# Patient Record
Sex: Female | Born: 1940 | Hispanic: No | Marital: Married | Smoking: Former smoker
Health system: Southern US, Community
[De-identification: ages and names within clinical notes are randomized; demographics above are authoritative.]

## PROBLEM LIST (undated history)

## (undated) DIAGNOSIS — S060X9A Concussion with loss of consciousness of unspecified duration, initial encounter: Secondary | ICD-10-CM

## (undated) DIAGNOSIS — R0789 Other chest pain: Secondary | ICD-10-CM

## (undated) DIAGNOSIS — K769 Liver disease, unspecified: Secondary | ICD-10-CM

## (undated) DIAGNOSIS — F32A Depression, unspecified: Secondary | ICD-10-CM

## (undated) DIAGNOSIS — F329 Major depressive disorder, single episode, unspecified: Secondary | ICD-10-CM

## (undated) DIAGNOSIS — I639 Cerebral infarction, unspecified: Secondary | ICD-10-CM

## (undated) DIAGNOSIS — I341 Nonrheumatic mitral (valve) prolapse: Secondary | ICD-10-CM

## (undated) DIAGNOSIS — R51 Headache: Secondary | ICD-10-CM

## (undated) DIAGNOSIS — R42 Dizziness and giddiness: Secondary | ICD-10-CM

## (undated) DIAGNOSIS — G4762 Sleep related leg cramps: Secondary | ICD-10-CM

## (undated) DIAGNOSIS — R413 Other amnesia: Secondary | ICD-10-CM

## (undated) DIAGNOSIS — E7800 Pure hypercholesterolemia, unspecified: Secondary | ICD-10-CM

## (undated) DIAGNOSIS — M1990 Unspecified osteoarthritis, unspecified site: Secondary | ICD-10-CM

## (undated) DIAGNOSIS — R269 Unspecified abnormalities of gait and mobility: Principal | ICD-10-CM

## (undated) DIAGNOSIS — I499 Cardiac arrhythmia, unspecified: Secondary | ICD-10-CM

## (undated) DIAGNOSIS — F0781 Postconcussional syndrome: Secondary | ICD-10-CM

## (undated) DIAGNOSIS — R519 Headache, unspecified: Secondary | ICD-10-CM

## (undated) DIAGNOSIS — Z889 Allergy status to unspecified drugs, medicaments and biological substances status: Secondary | ICD-10-CM

## (undated) DIAGNOSIS — M419 Scoliosis, unspecified: Secondary | ICD-10-CM

## (undated) DIAGNOSIS — E785 Hyperlipidemia, unspecified: Secondary | ICD-10-CM

## (undated) DIAGNOSIS — F419 Anxiety disorder, unspecified: Secondary | ICD-10-CM

## (undated) HISTORY — DX: Nonrheumatic mitral (valve) prolapse: I34.1

## (undated) HISTORY — DX: Concussion with loss of consciousness of unspecified duration, initial encounter: S06.0X9A

## (undated) HISTORY — DX: Depression, unspecified: F32.A

## (undated) HISTORY — DX: Hyperlipidemia, unspecified: E78.5

## (undated) HISTORY — DX: Postconcussional syndrome: F07.81

## (undated) HISTORY — DX: Major depressive disorder, single episode, unspecified: F32.9

## (undated) HISTORY — PX: OVARIAN CYST SURGERY: SHX726

## (undated) HISTORY — PX: TONSILLECTOMY: SUR1361

## (undated) HISTORY — PX: OTHER SURGICAL HISTORY: SHX169

## (undated) HISTORY — DX: Sleep related leg cramps: G47.62

## (undated) HISTORY — DX: Scoliosis, unspecified: M41.9

## (undated) HISTORY — DX: Headache: R51

## (undated) HISTORY — DX: Headache, unspecified: R51.9

## (undated) HISTORY — DX: Cardiac arrhythmia, unspecified: I49.9

## (undated) HISTORY — DX: Dizziness and giddiness: R42

## (undated) HISTORY — DX: Liver disease, unspecified: K76.9

## (undated) HISTORY — DX: Unspecified abnormalities of gait and mobility: R26.9

## (undated) HISTORY — DX: Anxiety disorder, unspecified: F41.9

## (undated) HISTORY — DX: Allergy status to unspecified drugs, medicaments and biological substances: Z88.9

## (undated) HISTORY — DX: Other amnesia: R41.3

## (undated) HISTORY — PX: RETINAL DETACHMENT SURGERY: SHX105

## (undated) HISTORY — DX: Pure hypercholesterolemia, unspecified: E78.00

## (undated) HISTORY — PX: COLONOSCOPY: SHX174

## (undated) HISTORY — DX: Other chest pain: R07.89

## (undated) HISTORY — DX: Unspecified osteoarthritis, unspecified site: M19.90

---

## 1998-12-24 ENCOUNTER — Encounter: Payer: Self-pay | Admitting: Internal Medicine

## 1998-12-24 ENCOUNTER — Ambulatory Visit (HOSPITAL_COMMUNITY): Admission: RE | Admit: 1998-12-24 | Discharge: 1998-12-24 | Payer: Self-pay | Admitting: Internal Medicine

## 2000-12-06 ENCOUNTER — Encounter: Payer: Self-pay | Admitting: Obstetrics and Gynecology

## 2000-12-06 ENCOUNTER — Encounter: Admission: RE | Admit: 2000-12-06 | Discharge: 2000-12-06 | Payer: Self-pay | Admitting: Obstetrics and Gynecology

## 2001-01-04 ENCOUNTER — Emergency Department (HOSPITAL_COMMUNITY): Admission: EM | Admit: 2001-01-04 | Discharge: 2001-01-04 | Payer: Self-pay | Admitting: Emergency Medicine

## 2001-05-11 HISTORY — PX: OTHER SURGICAL HISTORY: SHX169

## 2002-11-17 ENCOUNTER — Encounter: Admission: RE | Admit: 2002-11-17 | Discharge: 2002-11-17 | Payer: Self-pay | Admitting: Obstetrics and Gynecology

## 2002-11-17 ENCOUNTER — Encounter: Payer: Self-pay | Admitting: Obstetrics and Gynecology

## 2004-01-13 ENCOUNTER — Emergency Department (HOSPITAL_COMMUNITY): Admission: EM | Admit: 2004-01-13 | Discharge: 2004-01-14 | Payer: Self-pay | Admitting: Emergency Medicine

## 2004-07-24 ENCOUNTER — Ambulatory Visit (HOSPITAL_COMMUNITY): Admission: RE | Admit: 2004-07-24 | Discharge: 2004-07-24 | Payer: Self-pay | Admitting: Obstetrics and Gynecology

## 2005-11-06 ENCOUNTER — Ambulatory Visit (HOSPITAL_COMMUNITY): Admission: RE | Admit: 2005-11-06 | Discharge: 2005-11-06 | Payer: Self-pay | Admitting: Internal Medicine

## 2005-12-18 ENCOUNTER — Ambulatory Visit (HOSPITAL_BASED_OUTPATIENT_CLINIC_OR_DEPARTMENT_OTHER): Admission: RE | Admit: 2005-12-18 | Discharge: 2005-12-18 | Payer: Self-pay | Admitting: Orthopedic Surgery

## 2006-11-09 ENCOUNTER — Ambulatory Visit (HOSPITAL_COMMUNITY): Admission: RE | Admit: 2006-11-09 | Discharge: 2006-11-09 | Payer: Self-pay | Admitting: *Deleted

## 2007-07-14 ENCOUNTER — Encounter: Admission: RE | Admit: 2007-07-14 | Discharge: 2007-07-14 | Payer: Self-pay | Admitting: Family Medicine

## 2007-07-28 ENCOUNTER — Encounter: Admission: RE | Admit: 2007-07-28 | Discharge: 2007-07-28 | Payer: Self-pay | Admitting: Family Medicine

## 2008-02-08 ENCOUNTER — Encounter: Admission: RE | Admit: 2008-02-08 | Discharge: 2008-02-08 | Payer: Self-pay | Admitting: Family Medicine

## 2008-02-22 ENCOUNTER — Ambulatory Visit (HOSPITAL_COMMUNITY): Admission: RE | Admit: 2008-02-22 | Discharge: 2008-02-22 | Payer: Self-pay | Admitting: Family Medicine

## 2008-05-11 HISTORY — PX: CARDIAC CATHETERIZATION: SHX172

## 2009-03-28 ENCOUNTER — Ambulatory Visit (HOSPITAL_COMMUNITY): Admission: RE | Admit: 2009-03-28 | Discharge: 2009-03-28 | Payer: Self-pay | Admitting: Family Medicine

## 2009-09-09 ENCOUNTER — Encounter: Admission: RE | Admit: 2009-09-09 | Discharge: 2009-09-09 | Payer: Self-pay | Admitting: Family Medicine

## 2010-03-06 ENCOUNTER — Ambulatory Visit: Admission: RE | Admit: 2010-03-06 | Payer: Self-pay | Source: Home / Self Care | Admitting: Interventional Cardiology

## 2010-07-07 ENCOUNTER — Other Ambulatory Visit: Payer: Self-pay | Admitting: Gastroenterology

## 2010-07-11 ENCOUNTER — Ambulatory Visit
Admission: RE | Admit: 2010-07-11 | Discharge: 2010-07-11 | Disposition: A | Discharge status: Home / Self Care | Payer: 59 | Source: Ambulatory Visit | Attending: Gastroenterology | Admitting: Gastroenterology

## 2010-07-14 ENCOUNTER — Other Ambulatory Visit (HOSPITAL_COMMUNITY): Payer: Self-pay | Admitting: Internal Medicine

## 2010-07-14 DIAGNOSIS — Z1231 Encounter for screening mammogram for malignant neoplasm of breast: Secondary | ICD-10-CM

## 2010-07-23 ENCOUNTER — Ambulatory Visit (HOSPITAL_COMMUNITY)
Admission: RE | Admit: 2010-07-23 | Discharge: 2010-07-23 | Disposition: A | Discharge status: Home / Self Care | Payer: 59 | Source: Ambulatory Visit | Attending: Internal Medicine | Admitting: Internal Medicine

## 2010-07-23 DIAGNOSIS — Z1231 Encounter for screening mammogram for malignant neoplasm of breast: Secondary | ICD-10-CM | POA: Insufficient documentation

## 2010-09-26 NOTE — Op Note (Signed)
NAMESAESHA, LLERENAS             ACCOUNT NO.:  [id number]   MEDICAL RECORD NO.:  [id number]          PATIENT TYPE:  AMB   LOCATION:  DSC                          FACILITY:  [facility]   PHYSICIAN:  Katy Fitch. Sypher, M.D. 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   OPERATION:  1. Debridement of left thumb interphalangeal joint.  2. Excision of mucoid cyst left thumb distal phalangeal segment.   SURGEON:  Lovey Newcomer, M.D.   ASSISTANT:  Molly Maduro Dasnoit, P.A-C   ANESTHESIA:  2% lidocaine metacarpal head level block of left thumb  supplemented by IV sedation.   SUPERVISING ANESTHESIOLOGIST:  Janetta Hora. Gelene Mink, M.D.   INDICATIONS:  Libia Fazzini is a 70 year old woman referred for evaluation  and management of a  mucoid cyst involving her left thumb distal segment.  She has degenerative arthritis of multiple interphalangeal joints.  X-ray of  her thumb demonstrated a substantial loose body on the dorsal radial aspect  of her IP joint at the base of the distal phalanx.  This was likely the  irritant causing mucous cyst formation.   She requested excision of her cyst.  I recommended cyst excision and joint  debridement.   DESCRIPTION OF PROCEDURE:  Katanya Schlie is brought to the operating room  and placed in the supine position on the operating table.  Following light  sedation, the left arm was prepped  with Betadine soap and solution and  sterilely draped.  A 2% lidocaine metacarpal head level block was placed  without complication.  When anesthesia was satisfactory, the arm was  exsanguinated with an Esmarch bandage and an arterial tourniquet on the  proximal brachium inflated to 230 mmHg.  The procedure commenced with a  longitudinal incision direct ending over the cyst to the interphalangeal  extension creases.  The subcutaneous tissues were carefully divided taking  care to separate the cyst from the dermis.  This was circumferentially  dissected and removed with a rongeur.  The radial aspect of the IP joint was  explored.  The capsule between the radial collateral ligament and distal  extensor tendon resected followed by use of a microcurette and fine  periosteal elevator to remove a substantial loose body.  The loose body  measured 3 x 3 mm and was primarily cartilaginous with some ossification.  The joint was thoroughly  inspected and debrided with a micro rongeur and microcurette.  The joint was  then thoroughly lavaged with sterile saline utilizing a blunt dental needle.  The wound was repaired with mattress suture of 5-0 nylon.  There were no  apparent complications.  Katy Fitch Sypher, M.D.  Electronically Signed     RVS/MEDQ  D:  12/18/2005  T:  12/18/2005  Job:  [id number]

## 2011-05-10 ENCOUNTER — Ambulatory Visit (INDEPENDENT_AMBULATORY_CARE_PROVIDER_SITE_OTHER): Payer: 59

## 2011-05-10 DIAGNOSIS — J209 Acute bronchitis, unspecified: Secondary | ICD-10-CM

## 2011-11-13 ENCOUNTER — Other Ambulatory Visit (HOSPITAL_COMMUNITY): Payer: Self-pay | Admitting: Family Medicine

## 2011-11-13 DIAGNOSIS — Z1231 Encounter for screening mammogram for malignant neoplasm of breast: Secondary | ICD-10-CM

## 2011-12-04 ENCOUNTER — Ambulatory Visit (HOSPITAL_COMMUNITY): Payer: 59

## 2012-03-09 ENCOUNTER — Other Ambulatory Visit (HOSPITAL_COMMUNITY): Payer: Self-pay | Admitting: Family Medicine

## 2012-03-09 DIAGNOSIS — Z1231 Encounter for screening mammogram for malignant neoplasm of breast: Secondary | ICD-10-CM

## 2012-04-04 ENCOUNTER — Ambulatory Visit (HOSPITAL_COMMUNITY)
Admission: RE | Admit: 2012-04-04 | Discharge: 2012-04-04 | Disposition: A | Discharge status: Home / Self Care | Payer: 59 | Source: Ambulatory Visit | Attending: Family Medicine | Admitting: Family Medicine

## 2012-04-04 DIAGNOSIS — Z1231 Encounter for screening mammogram for malignant neoplasm of breast: Secondary | ICD-10-CM | POA: Insufficient documentation

## 2012-04-08 ENCOUNTER — Other Ambulatory Visit: Payer: Self-pay | Admitting: Family Medicine

## 2012-04-08 DIAGNOSIS — R928 Other abnormal and inconclusive findings on diagnostic imaging of breast: Secondary | ICD-10-CM

## 2012-04-19 ENCOUNTER — Ambulatory Visit
Admission: RE | Admit: 2012-04-19 | Discharge: 2012-04-19 | Disposition: A | Discharge status: Home / Self Care | Payer: 59 | Source: Ambulatory Visit | Attending: Family Medicine | Admitting: Family Medicine

## 2012-04-19 DIAGNOSIS — R928 Other abnormal and inconclusive findings on diagnostic imaging of breast: Secondary | ICD-10-CM

## 2012-06-20 ENCOUNTER — Other Ambulatory Visit: Payer: Self-pay | Admitting: Gastroenterology

## 2012-06-20 DIAGNOSIS — R1032 Left lower quadrant pain: Secondary | ICD-10-CM

## 2012-06-21 ENCOUNTER — Ambulatory Visit
Admission: RE | Admit: 2012-06-21 | Discharge: 2012-06-21 | Disposition: A | Discharge status: Home / Self Care | Payer: 59 | Source: Ambulatory Visit | Attending: Gastroenterology | Admitting: Gastroenterology

## 2012-06-21 DIAGNOSIS — R1032 Left lower quadrant pain: Secondary | ICD-10-CM

## 2012-06-21 MED ORDER — IOHEXOL 300 MG/ML  SOLN
100.0000 mL | Freq: Once | INTRAMUSCULAR | Status: AC | PRN
  Administered 2012-06-21: 100 mL via INTRAVENOUS

## 2013-10-30 ENCOUNTER — Other Ambulatory Visit (HOSPITAL_COMMUNITY): Payer: Self-pay | Admitting: Family Medicine

## 2013-10-30 DIAGNOSIS — Z1231 Encounter for screening mammogram for malignant neoplasm of breast: Secondary | ICD-10-CM

## 2013-11-07 ENCOUNTER — Ambulatory Visit (HOSPITAL_COMMUNITY)
Admission: RE | Admit: 2013-11-07 | Discharge: 2013-11-07 | Disposition: A | Discharge status: Home / Self Care | Payer: 59 | Source: Ambulatory Visit | Attending: Family Medicine | Admitting: Family Medicine

## 2013-11-07 DIAGNOSIS — Z803 Family history of malignant neoplasm of breast: Secondary | ICD-10-CM | POA: Insufficient documentation

## 2013-11-07 DIAGNOSIS — Z1231 Encounter for screening mammogram for malignant neoplasm of breast: Secondary | ICD-10-CM | POA: Insufficient documentation

## 2014-02-06 ENCOUNTER — Encounter: Payer: Self-pay | Admitting: *Deleted

## 2014-09-14 ENCOUNTER — Encounter: Payer: Self-pay | Admitting: Neurology

## 2014-09-14 ENCOUNTER — Ambulatory Visit (INDEPENDENT_AMBULATORY_CARE_PROVIDER_SITE_OTHER): Payer: PPO | Admitting: Neurology

## 2014-09-14 VITALS — BP 136/75 | HR 64 | Ht 67.0 in | Wt 152.4 lb

## 2014-09-14 DIAGNOSIS — R42 Dizziness and giddiness: Secondary | ICD-10-CM | POA: Diagnosis not present

## 2014-09-14 DIAGNOSIS — G4762 Sleep related leg cramps: Secondary | ICD-10-CM

## 2014-09-14 DIAGNOSIS — R269 Unspecified abnormalities of gait and mobility: Secondary | ICD-10-CM

## 2014-09-14 HISTORY — DX: Unspecified abnormalities of gait and mobility: R26.9

## 2014-09-14 HISTORY — DX: Sleep related leg cramps: G47.62

## 2014-09-14 NOTE — Patient Instructions (Signed)

## 2014-09-14 NOTE — Progress Notes (Signed)
Reason for visit: Gait disorder  Referring physician: Dr. Rennie Plowman is a 74 y.o. female  History of present illness:  Ms. Gryder is a 74 year old right-handed white female with a history of a gait disorder that she believes has been present for about one year. The patient believes there has been some progression in the ability to ambulate. She will stumble on occasion, and she might fall at times. The last fall was about 2 weeks prior to this evaluation. The patient reports some dizziness that is present with standing, and goes away with sitting. She reports some neck pain and low back pain without radiation down the arms. She has frequent leg and hand cramps, particularly at night. She denies any numbness or true weakness of extremities. She has some difficulty controlling the bowels and the bladder. She has a prior history of migraine headache, but she does not get headaches anymore at this time. Occasionally, she might have tingling around the mouth. She is on some medication for depression, but she only takes Prozac. She will take Ativan at night, only in low dose at 0.5 mg. Rarely, she may take alprazolam to help her sleep. She was seen by her primary care physician, and she was referred to this office for an evaluation.  Past Medical History  Diagnosis Date  . Atypical chest pain     normal ETT/Echo in 2003.. card cath normal 2010  . Hypercholesteremia   . Anxiety   . Multiple allergies   . Hyperlipidemia   . MVP (mitral valve prolapse)   . Arthritis   . Scoliosis   . Headache   . Liver disease   . Abnormal heart rhythm   . 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 Nocturnal leg cramps 09/14/2014    Past Surgical History  Procedure Laterality Date  . Cardiac catheterization  2010    normal  . Ett  2003    also ECHO..normal  . Ovarian cyst surgery    . Tonsillectomy    . Cesarean section    . Cataract surgery    . Colonoscopy      Family History  Problem Relation Age  of Onset  . Heart disease Mother   . CAD Mother   . AAA (abdominal aortic aneurysm) Father   . Bladder Cancer Father   . Kidney cancer Sister     metastasis to lung  . HIV Son     Social history:  reports that she quit smoking about 48 years ago. She does not have any smokeless tobacco history on file. She reports that she drinks alcohol. She reports that she does not use illicit drugs.  Medications:  Prior to Admission medications   Medication Sig Start Date End Date Taking? Authorizing Provider  ALPRAZolam [name]) 0.25 MG tablet Take 0.25 mg by mouth at bedtime as needed for anxiety.   Yes Historical Provider, MD  Calcium-Vitamin D-Vitamin K 500-100-40 MG-UNT-MCG CHEW Chew 1 tablet by mouth daily.   Yes Historical Provider, MD  cholecalciferol (VITAMIN D) 1000 UNITS tablet Take 2,000 Units by mouth daily.   Yes Historical Provider, MD  Coenzyme Q10 (CO Q 10 PO) Take 1 capsule by mouth daily.   Yes Historical Provider, MD  Cyanocobalamin 2500 MCG CHEW Chew 1 tablet by mouth daily.   Yes Historical Provider, MD  ezetimibe (ZETIA) 10 MG tablet Take 10 mg by mouth daily.   Yes Historical Provider, MD  FLUoxetine (PROZAC) 20 MG capsule Take 20 mg  by mouth daily.   Yes Historical Provider, MD  Glucosamine HCl 1500 MG TABS Take 1 tablet by mouth 2 (two) times daily.   Yes Historical Provider, MD  GuaiFENesin (MUCINEX PO) Take by mouth as directed. No specified directions in paperwork.   Yes Historical Provider, MD  Ibuprofen (ADVIL PO) Take by mouth as directed. No specified directions in paperwork   Yes Historical Provider, MD  LORazepam (ATIVAN) 0.5 MG tablet Take 0.5 mg by mouth at bedtime as needed for anxiety.   Yes Historical Provider, MD  Omega 3 1200 MG CAPS Take 1 capsule by mouth daily.   Yes Historical Provider, MD  Turmeric Curcumin 500 MG CAPS Take 1 capsule by mouth daily.   Yes Historical Provider, MD      Allergies  Allergen Reactions  . Indocin [Indomethacin] Other (See  Comments)    Nausea and vomiting   . Keflex [Cephalexin] Other (See Comments)    unknown reaction   . Lipitor [Atorvastatin] Other (See Comments)    Muscle aches   . Pravastatin Other (See Comments)    Muscle aches   . Tape Rash    PAPER TAPE --- RASH    ROS:  Out of a complete 14 system review of symptoms, the patient complains only of the following symptoms, and all other reviewed systems are negative.  Gait disorder Neck pain, back pain Depression, dizziness  Blood pressure 136/75, pulse 64, height 5\' 7"  (1.702 m), weight 152 lb 6.4 oz (69.128 kg).   Blood pressure, right arm, sitting is 1:30/80. Blood pressure, standing, right arm is 126/80.  Physical Exam  General: The patient is alert and cooperative at the time of the examination.  Eyes: Pupils are equal, round, and reactive to light. Discs are flat bilaterally.  Neck: The neck is supple, no carotid bruits are noted.  Respiratory: The respiratory examination is clear.  Cardiovascular: The cardiovascular examination reveals a regular rate and rhythm, no obvious murmurs or rubs are noted.  Skin: Extremities are without significant edema.  Neurologic Exam  Mental status: The patient is alert and oriented x 3 at the time of the examination. The patient has apparent normal recent and remote memory, with an apparently normal attention span and concentration ability.  Cranial nerves: Facial symmetry is present. There is good sensation of the face to pinprick and soft touch bilaterally. The strength of the facial muscles and the muscles to head turning and shoulder shrug are normal bilaterally. Speech is well enunciated, no aphasia or dysarthria is noted. Extraocular movements are full. Visual fields are full. The tongue is midline, and the patient has symmetric elevation of the soft palate. No obvious hearing deficits are noted.  Motor: The motor testing reveals 5 over 5 strength of all 4 extremities. Good symmetric  motor tone is noted throughout.  Sensory: Sensory testing is intact to pinprick, soft touch, vibration sensation, and position sense on all 4 extremities. No evidence of extinction is noted.  Coordination: Cerebellar testing reveals good finger-nose-finger and heel-to-shin bilaterally.  Gait and station: Gait is normal. Tandem gait is slightly unsteady. Romberg is negative. No drift is seen.  Reflexes: Deep tendon reflexes are symmetric and normal bilaterally, with exception that the ankle jerk reflexes were depressed. Toes are downgoing bilaterally.   Assessment/Plan:  1. Mild gait disorder  2. Nocturnal leg cramps  The patient reports some mild dizziness and some gait problems. The patient has minimal instability with clinical examination today. The patient will undergo some blood  work for evaluation, and she will be set up for MRI of the brain. If the studies are relatively unremarkable, the patient may benefit from balance training, and then enter activities that work on balance such as yoga or Pilates. The patient will follow-up in 4 months.  Jill Alexanders MD 09/14/2014 8:21 PM  [facility] [address] [place], [place] [address]  Phone [phone] Fax [phone]

## 2014-09-17 LAB — VITAMIN B12: Vitamin B-12: 1633 pg/mL — ABNORMAL HIGH (ref 211–946)

## 2014-09-17 LAB — CK: CK TOTAL: 69 U/L (ref 24–173)

## 2014-09-17 LAB — RPR: RPR: NONREACTIVE

## 2014-09-17 LAB — METHYLMALONIC ACID, SERUM: Methylmalonic Acid: 105 nmol/L (ref 0–378)

## 2014-09-17 LAB — COPPER, SERUM: COPPER: 124 ug/dL (ref 72–166)

## 2014-09-18 ENCOUNTER — Telehealth: Payer: Self-pay

## 2014-09-18 NOTE — Telephone Encounter (Signed)
Left voicemail. Relayed results.

## 2014-09-18 NOTE — Telephone Encounter (Signed)
-----   Message from Kathrynn Ducking, MD sent at 09/17/2014  2:16 PM EDT -----  The blood work results are unremarkable. Please call the patient.  ----- Message -----    From: [facility] Lab Results In Interface    Sent: 09/15/2014   7:45 AM      To: Kathrynn Ducking, MD

## 2014-09-23 ENCOUNTER — Ambulatory Visit
Admission: RE | Admit: 2014-09-23 | Discharge: 2014-09-23 | Disposition: A | Discharge status: Home / Self Care | Payer: Self-pay | Source: Ambulatory Visit | Attending: Neurology | Admitting: Neurology

## 2014-09-23 DIAGNOSIS — R269 Unspecified abnormalities of gait and mobility: Secondary | ICD-10-CM

## 2014-09-23 DIAGNOSIS — G4762 Sleep related leg cramps: Secondary | ICD-10-CM

## 2014-09-23 DIAGNOSIS — R42 Dizziness and giddiness: Secondary | ICD-10-CM | POA: Diagnosis not present

## 2014-09-24 ENCOUNTER — Telehealth: Payer: Self-pay | Admitting: Neurology

## 2014-09-24 DIAGNOSIS — R269 Unspecified abnormalities of gait and mobility: Secondary | ICD-10-CM

## 2014-09-24 NOTE — Telephone Encounter (Signed)
I called the patient. The MRI of the brain shows some BS compression by vascular structures, this is the likely source of the dizziness and the gait problems. I will set up PT for this.    MRI brain 09/24/14:  IMPRESSION: This MRI of the brain without contrast shows the following: 1. 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 The right vertebral artery is very tortuous and has a diameter of 6.7 mm. The artery slightly displaces the medulla towards the left. 2. Mild cortical atrophy that is more evident in the mesial temporal lobes 3. Scattered T2/flair hyperintense foci consistent with small vessel ischemic changes. The extent is mildly more than expected for age.

## 2014-09-25 NOTE — Telephone Encounter (Signed)
I spoke to the patient. Laura Pollard stated Laura Pollard was shocked when Laura Pollard received the call from Dr. Jannifer Franklin. Since the call Laura Pollard has thought of several questions would like to ask him. Laura Pollard wants to know the actual diagnosis so Laura Pollard can research it, the cause of it, how long Laura Pollard could have had it, her prognosis, and if Laura Pollard should have any lifting restrictions at work. Please call and advise.

## 2014-09-25 NOTE — Telephone Encounter (Signed)
I called the patient. I discussed the results of the MRI again. The patient asked the same question 3 times, she may be having significant issues with memory. This may require workup in the future.

## 2014-09-25 NOTE — Telephone Encounter (Signed)
Patient called back and requested to speak with the nurse regarding some questions she has about her results. Please call and advise.

## 2014-10-04 ENCOUNTER — Encounter: Payer: Self-pay | Admitting: Neurology

## 2014-10-04 ENCOUNTER — Telehealth: Payer: Self-pay | Admitting: Neurology

## 2014-10-04 NOTE — Telephone Encounter (Signed)
I called the patient. She had an episode at work where she was perspiring, shaking and felt dizzy. She said this episode was more intense than before and she wanted to speak to Dr. Jannifer Franklin about what she could do to prevent this from happening. I asked if she had started PT yet, as Dr. Jannifer Franklin had advised but she has not heard from them. I will follow up on this referral. She also wanted Dr. Jannifer Franklin to be aware that her psychiatrist started her on temazepam recently and she wondered if this could be causing her symptoms to be worse. She seems to still have questions about her MRI, as well.

## 2014-10-04 NOTE — Telephone Encounter (Signed)
I called patient. The patient had a dizzy episode associated with diaphoresis, feeling shaky, nauseated. The patient sat down, took about 15 minutes to feel better. This sounds like a vasovagal event to me. I discussed this with the patient. Given her issues, I'll write a letter indicated that she is not to climb on ladders at work.

## 2014-10-04 NOTE — Telephone Encounter (Signed)
Called [facility] they had not scheduled patient yet. Patient is scheduled for June 6th arrive at 10:45. I have called patient she is aware of date, time and where to go for her apt. Patient is fine and understood process.

## 2014-10-04 NOTE — Telephone Encounter (Signed)
Patient called stating she had more questions regarding the MRI results. She also states she had an episode where she had to sit down(did not want to into detail. Please call and advise. Patient can be reached at [phone].

## 2014-10-15 ENCOUNTER — Telehealth: Payer: Self-pay | Admitting: Physical Therapy

## 2014-10-15 ENCOUNTER — Encounter: Payer: Self-pay | Admitting: Physical Therapy

## 2014-10-15 ENCOUNTER — Ambulatory Visit: Payer: PPO | Attending: Neurology | Admitting: Physical Therapy

## 2014-10-15 DIAGNOSIS — R269 Unspecified abnormalities of gait and mobility: Secondary | ICD-10-CM | POA: Insufficient documentation

## 2014-10-15 DIAGNOSIS — R278 Other lack of coordination: Secondary | ICD-10-CM | POA: Insufficient documentation

## 2014-10-15 DIAGNOSIS — R42 Dizziness and giddiness: Secondary | ICD-10-CM | POA: Diagnosis not present

## 2014-10-15 NOTE — Telephone Encounter (Signed)
Patient has seen PT, who reccomends ST for cognitive impairments. This has not been assessed in our office, Will do cognitive eval on the next RV.

## 2014-10-15 NOTE — Therapy (Signed)
[facility] [address] [place], [place], [address] Phone: [phone]   Fax:  [phone]  Physical Therapy Evaluation  Patient Details  Name: Laura Pollard MRN: [id number] Date of Birth: 12/07/1940 Referring Provider:  Kathrynn Ducking, MD  Encounter Date: 10/15/2014      PT End of Session - 10/15/14 1233    Visit Number 1   Number of Visits 9   Date for PT Re-Evaluation 12/14/14   Authorization Type Medicare - G Codes every 10th visit   PT Start Time 1107   PT Stop Time 1202   PT Time Calculation (min) 55 min   Equipment Utilized During Treatment Gait belt   Activity Tolerance Patient tolerated treatment well   Behavior During Therapy [facility] for tasks assessed/performed      Past Medical History  Diagnosis Date  . Atypical chest pain     normal ETT/Echo in 2003.. card cath normal 2010  . Hypercholesteremia   . Anxiety   . Multiple allergies   . Hyperlipidemia   . MVP (mitral valve prolapse)   . Arthritis   . Scoliosis   . Headache   . Liver disease   . Abnormal heart rhythm   . 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 Nocturnal leg cramps 09/14/2014  . Depression     Past Surgical History  Procedure Laterality Date  . Cardiac catheterization  2010    normal  . Ett  2003    also ECHO..normal  . Ovarian cyst surgery    . Tonsillectomy    . Cesarean section    . Cataract surgery    . Colonoscopy    . Retinal detachment surgery Right 10 years ago    There were no vitals filed for this visit.  Visit Diagnosis:  Abnormality of gait  Dizziness and giddiness  Abnormal coordination      Subjective Assessment - 10/15/14 1120    Subjective Pt dizziness, lightheadedness, frequent LOB, postural sway (per report of co-workers), and frequent falls. Pt unsure as to what causes dizziness, but does attribute lightheadedness to "getting up too fast," per pt.  Pt reports increasingly more difficulty working  full-time secondary to balance impairments.   Pertinent History Vertebrobasilar Artery Insufficiency (VBI) - Avoid endrange cervical spine extension; Anxiety   Currently in Pain? No/denies            [facility] PT Assessment - 10/15/14 0001    Assessment   Medical Diagnosis Abnormality of gait   Onset Date/Surgical Date 09/24/14  MD referral date   Prior Therapy N/A   Precautions   Precautions Fall;Other (comment)   Precaution Comments No getting onto ladders   Required Braces or Orthoses Other Brace/Splint   Restrictions   Weight Bearing Restrictions No   Balance Screen   Has the patient fallen in the past 6 months Yes   How many times? 1  "I've only fallen to the ground once; I catch myself a lot"   Has the patient had a decrease in activity level because of a fear of falling?  Yes   Is the patient reluctant to leave their home because of a fear of falling?  No   Home Social worker Private residence   Living Arrangements Spouse/significant other   Available Help at Discharge Family   Type of [place] to enter   Entrance Stairs-Number of Steps 2   [place]  Two level   Alternate Level Stairs-Number of Steps 26  2 flights   Alternate Level Stairs-Rails Can reach both   Home Equipment None   Prior Function   Level of Independence Independent;Independent with basic ADLs;Independent with community mobility without device;Independent with gait   Vocation Full time employment   Vocation Requirements Works at [employer] Status Impaired/Different from baseline   Area of Impairment Attention;Memory   Current Attention Level Sustained   Memory Decreased short-term memory   Memory Comments "Feels like there's dust in there"   Attention Sustained   Sustained Attention Impaired   Sustained Attention Impairment Verbal complex   Memory Impaired   Memory Impairment Decreased short term  memory;Retrieval deficit   Sensation   Light Touch Appears Intact   Proprioception Appears Intact   Additional Comments Per chart, tingling around mouth   Coordination   Gross Motor Movements are Fluid and Coordinated No   Coordination and Movement Description Uncoordinated gait more prominent with turning and obstacle negotiation   ROM / Strength   AROM / PROM / Strength Strength   Strength   Overall Strength Within functional limits for tasks performed   Overall Strength Comments B LE   Ambulation/Gait   Ambulation/Gait Yes   Ambulation/Gait Assistance 4: Min guard;4: Min assist   Ambulation/Gait Assistance Details Min A to recover from LOB with obstacle negotiation, turning 180 degrees, and with head turns in all directions (most prominent with looking to L and upward)   Ambulation Distance (Feet) 350 Feet   Assistive device None   Gait Pattern Step-through pattern;Wide base of support;Lateral trunk lean to right   Ambulation Surface Level;Indoor   Stairs Yes   Stairs Assistance 5: Supervision   Stair Management Technique No rails;Alternating pattern;Step to pattern;Forwards   Number of Stairs 4   Height of Stairs 6   Gait Comments ascended stairs with reciprocal pattern; descended with step-to   Standardized Balance Assessment   Standardized Balance Assessment Dynamic Gait Index   Dynamic Gait Index   Level Surface Mild Impairment   Change in Gait Speed Normal   Gait with Horizontal Head Turns Mild Impairment  increased nausea with head turn to L    Gait with Vertical Head Turns Moderate Impairment  increased dizziness, postural instability with looking up   Gait and Pivot Turn Severe Impairment  Significant LOB   Step Over Obstacle Mild Impairment   Step Around Obstacles Moderate Impairment   Steps Moderate Impairment  step-to pattern during descent   Total Score 12              PT Education - 10/15/14 1232    Education provided Yes   Education Details  Evaluation findings, goals, POC.   Person(s) Educated Patient   Methods Explanation   Comprehension Verbalized understanding          PT Short Term Goals - 10/15/14 1255    PT SHORT TERM GOAL #1   Title Pt will demonstrate HEP with mod I using paper handout to maximize functional gains made in physical therapy. Target date: 74//16.   Time 4   Period Weeks   Status New   PT SHORT TERM GOAL #2   Title Pt will verbalize understanding of fall prevention strategies to decrease risk of falling within home. Target date: 11/12/14.   Time 4   Period Weeks   Status New   PT SHORT TERM GOAL #3   Title Pt will score  16/24 on Dynamic Gait Index to demonstrate improved gait stability in the presence of external demands. Target date: 11/12/14.   Time 4   Period Weeks   Status New           PT Long Term Goals - 16-Oct-2014 1255    PT LONG TERM GOAL #1   Title Pt will score 20/24 on Dynamic Gait Index to demonstrate decreased risk of falling. Target date: 12/10/14.   Time 8   Period Weeks   Status New   PT LONG TERM GOAL #2   Title Pt will ambulate >500' over level/unlevel surfaces with mod I using LRAD to demonstrate safety with community mobility. Target date: 12/10/14.   Time 8   Period Weeks   Status New   PT LONG TERM GOAL #3   Title Pt will demonstrate use of 2 compensatory strategies for dizziness during functional mobility without cueing to increase pt safety with functional mobility. Target date: 12/10/14.   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  On PT evaluation, the pt demonstrates the following impairments:  increased risk of falling, as exhibited by score of 12/24 on Dynamic Gait Index; gait instability, as demonstrated by multiple significant  losses of balance with 180-turns and obstacle negotiation during gait; cognitive impairments, as reported by patient and noted during PT evaluation. Recommending skilled PT 2x/week for 8 weeks; however, pt will be seen for skilled PT 1x/week for 8 weeks, as pt is limited by full-time jo   Pt will benefit from skilled therapeutic intervention in order to improve on the following deficits Abnormal gait;Decreased balance;Decreased cognition;Decreased mobility;Decreased knowledge of use of DME;Decreased coordination;Decreased safety awareness;Postural dysfunction;Dizziness;Decreased activity tolerance;Impaired perceived functional ability   Rehab Potential Good   Clinical Impairments Affecting Rehab Potential Cognitive impairments   PT Frequency 1x / week  Recommending 2x/week for 8 weeks; however, pt limited by full-time job to [employer]   PT Duration 8 weeks   PT Treatment/Interventions ADLs/Self Care Home Management;Vestibular;Functional mobility training;Stair training;Gait training;DME Instruction;Therapeutic activities;Therapeutic exercise;Balance training;Neuromuscular re-education;Cognitive remediation;Patient/family education;Manual techniques   PT Next Visit Plan Initiate HEP; mildly provoke symptoms (avoiding cervical spine extension, lightheadedness, nausea); initiate compensatory strategies (wide BOS with onset of symptoms; eyes then head the body with turning) and assess for within-session carryover   PT Home Exercise Plan Progress from standing to ambulation with head turns; attempt vestibular adaptation (gaze stabilization, static/dynamic balance exercises) then compensatory strategies to increase safety with onset of dizziness.   Recommended Other Services Phone encounter completed to recommend fro SLP evaluation; follow up on this order.   Consulted and Agree with Plan of Care Patient          G-Codes - 2014/10/16 1257    Functional Assessment Tool Used Dynamic Gait Index   Functional  Limitation Mobility: Walking and moving around   Mobility: Walking and Moving Around Current Status [phone]) At least 40 percent but less than 60 percent impaired, limited or restricted   Mobility: Walking and Moving Around Goal Status [phone]) At least 1 percent but less than 20 percent impaired, limited or restricted       Problem List Patient Active Problem List   Diagnosis Date Noted  . 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  Dizziness and giddiness 09/14/2014  . Nocturnal leg cramps 09/14/2014    Billie Ruddy, PT, DPT [facility] [address] [place], [place], [address] Phone: [phone]   Fax:  [phone] 10/15/2014, 4:00 PM

## 2014-10-15 NOTE — Telephone Encounter (Signed)
PT evaluation completed. Patient would benefit from Speech Therapy evaluation due to cognitive impairments. Please send referral for SLP evaluation if you agree.  Thanks so much,  Billie Ruddy, PT, DPT Outpatient Neurorehab

## 2014-11-02 ENCOUNTER — Telehealth: Payer: Self-pay | Admitting: Physical Therapy

## 2014-11-02 ENCOUNTER — Ambulatory Visit: Payer: PPO | Admitting: Physical Therapy

## 2014-11-02 DIAGNOSIS — R269 Unspecified abnormalities of gait and mobility: Secondary | ICD-10-CM

## 2014-11-02 DIAGNOSIS — R278 Other lack of coordination: Secondary | ICD-10-CM

## 2014-11-02 DIAGNOSIS — R42 Dizziness and giddiness: Secondary | ICD-10-CM

## 2014-11-02 NOTE — Telephone Encounter (Signed)
Dr. Jannifer Franklin,  Thank you for acknowledging that. The patient has indicated during her PT session with me today that she won't be returning to your office until September. Would it be possible for you to assess her cognition before then? Per my conversation with the patient, this seems to be impacting her quality of life and safety.  Thanks so much, Billie Ruddy, PT, DPT [facility] [address] [place], [place], [address] Phone: [phone]   Fax:  [phone] 11/02/2014, 1:17 PM

## 2014-11-02 NOTE — Telephone Encounter (Signed)
We will evaluate her cognitive issues in the future.

## 2014-11-02 NOTE — Patient Instructions (Addendum)
Gaze Stabilization: Standing Feet Apart   Feet shoulder width apart, keeping eyes on target on wall arm's length away, tilt head down 15-30 and move head side to side for _30___ seconds. Repeat while moving head up and down for _30___ seconds. Do this 3 times per day.  Feet Apart (Compliant Surface) Varied Arm Positions - Eyes Open   With eyes open, standing on compliant surface: PILLOW, feet shoulder width apart and arms AT YOUR SIDE, look at a stationary object. Hold  30-45 seconds. Repeat  3  times per session. Do 1-2 sessions per day. Repeat the same exercise with eyes closed.      Copyright  VHI. All rights reserved.

## 2014-11-02 NOTE — Therapy (Signed)
[facility] [address] [place], [place], [address] Phone: [phone]   Fax:  [phone]  Physical Therapy Treatment  Patient Details  Name: Laura Pollard MRN: [id number] Date of Birth: 1941/03/20 Referring Provider:  Antony Blackbird, MD  Encounter Date: 11/02/2014      PT End of Session - 11/02/14 1251    Visit Number 2   Number of Visits 9   Date for PT Re-Evaluation 12/14/14   Authorization Type Medicare - G Codes every 10th visit   PT Start Time 54  Pt arrived late for session   PT Stop Time 1234   PT Time Calculation (min) 41 min   Equipment Utilized During Treatment Gait belt   Activity Tolerance Patient tolerated treatment well   Behavior During Therapy Anxious      Past Medical History  Diagnosis Date  . Atypical chest pain     normal ETT/Echo in 2003.. card cath normal 2010  . Hypercholesteremia   . Anxiety   . Multiple allergies   . Hyperlipidemia   . MVP (mitral valve prolapse)   . Arthritis   . Scoliosis   . Headache   . Liver disease   . Abnormal heart rhythm   . 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 Nocturnal leg cramps 09/14/2014  . Depression     Past Surgical History  Procedure Laterality Date  . Cardiac catheterization  2010    normal  . Ett  2003    also ECHO..normal  . Ovarian cyst surgery    . Tonsillectomy    . Cesarean section    . Cataract surgery    . Colonoscopy    . Retinal detachment surgery Right 10 years ago    There were no vitals filed for this visit.  Visit Diagnosis:  Abnormality of gait  Dizziness and giddiness  Abnormal coordination      Subjective Assessment - 11/02/14 1200    Subjective Pt reports no falls, one "near-miss" since PT evaluation. Pt reporting ongoing dizziness, vertginous symptoms. Pt inquiring as to whether or not this is because of the crystals in her ears. Pt unable to recall conversation about VBI on evaluation. Pt expressing emotional  distress due to inability to remember the faces of old friends at work. When notified that referring MD recommending pt have cognitive testing at next MD appt, pt stated, "but that's in September," and was tearful for brief time.   Pertinent History Vertebrobasilar Artery Insufficiency (VBI) - Avoid endrange cervical spine extension; Anxiety   Currently in Pain? No/denies          Treatment    Therapeutic Activities: - See patient education section for detail.  Neuro Re-ed: Pt performed the following corner balance exercises with supervision, chair in front of pt to ensure pt safety. - Standing on compliant surface (one pillow) with feet shoulder-width apart with eyes closed x30 seconds (3 trials). Noted posterior preference. Added to HEP. - Standing on non-compliant surface with eyes closed, pt performed horizontal, vertical, and diagonal head turns 2 x20 reps per direction for each condition. - VOR x1 viewing in standing without UE support, chair in front of pt with visual target arms-length away 2 trials x30 seconds horizontal then vertical head turns.                            PT Education - 11/02/14 1248    Education provided Yes   Education  Details HEP initiated; see Pt Education. Reiterated explanation of VBI. Recommended pt avoid endrange cervical spine extension. 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   Person(s) Educated Patient   Methods Explanation;Demonstration;Handout   Comprehension Verbalized understanding;Need further instruction  Pt will likely need reinforcement          PT Short Term Goals - 11/02/14 1255    PT SHORT TERM GOAL #1   Title Pt will demonstrate HEP with mod I using paper handout to maximize functional gains made in physical therapy. Target date: 74//16.   Status On-going   PT SHORT TERM GOAL #2   Title Pt will verbalize  understanding of fall prevention strategies to decrease risk of falling within home. Target date: 11/12/14.   Status On-going   PT SHORT TERM GOAL #3   Title Pt will score 16/24 on Dynamic Gait Index to demonstrate improved gait stability in the presence of external demands. Target date: 11/12/14.   Status On-going           PT Long Term Goals - 11/02/14 1256    PT LONG TERM GOAL #1   Title Pt will score 20/24 on Dynamic Gait Index to demonstrate decreased risk of falling. Target date: 12/10/14.   Status On-going   PT LONG TERM GOAL #2   Title Pt will ambulate >500' over level/unlevel surfaces with mod I using LRAD to demonstrate safety with community mobility. Target date: 12/10/14.   Status On-going   PT LONG TERM GOAL #3   Title Pt will demonstrate use of 2 compensatory strategies for dizziness during functional mobility without cueing to increase pt safety with functional mobility. Target date: 12/10/14.   Status On-going               Plan - 11/02/14 1252    Clinical Impression Statement Session focused on initiating HEP with emphasis on adaptation to vestibular impairments (gaze stabilization, decreasing visual/somatosensory input to increase reliance on vestibular input). Continue per POC.   Pt will benefit from skilled therapeutic intervention in order to improve on the following deficits Abnormal gait;Decreased balance;Decreased cognition;Decreased mobility;Decreased knowledge of use of DME;Decreased coordination;Decreased safety awareness;Postural dysfunction;Dizziness;Decreased activity tolerance;Impaired perceived functional ability   Clinical Impairments Affecting Rehab Potential Cognitive impairments   PT Frequency 1x / week  Recommended 2x/week for 8 weeks; pt able to do 1x/week for 8 weeks due to limitations of full-time job   PT Duration 8 weeks   PT Treatment/Interventions ADLs/Self Care Home Management;Vestibular;Functional mobility training;Stair training;Gait  training;DME Instruction;Therapeutic activities;Therapeutic exercise;Balance training;Neuromuscular re-education;Cognitive remediation;Patient/family education;Manual techniques   PT Next Visit Plan Assess HEP performance for safety, technique. Print corner exercises (eyes closed with head turns) for pt. Mildly provoke symptoms (avoiding cervical spine extension, lightheadedness, nausea); initiate compensatory strategies (wide BOS with onset of symptoms; eyes then head the body with turning) and assess for within-session carryover   PT Home Exercise Plan Progress from standing to ambulation with head turns; attempt vestibular adaptation (gaze stabilization, static/dynamic balance exercises) then compensatory strategies to increase safety with onset of dizziness.   Recommended Other Services Follow up on MD response to telephone encounter on 6/24 for cognitive testing.   Consulted and Agree with Plan of Care Patient        Problem List Patient Active Problem List   Diagnosis Date Noted  . 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 Dizziness and giddiness 09/14/2014  . Nocturnal leg cramps 09/14/2014  Billie Ruddy, PT, [facility] [address] [place], [place], [address] Phone: [phone]   Fax:  [phone] 11/02/2014, 1:10 PM

## 2014-12-04 ENCOUNTER — Telehealth: Payer: Self-pay | Admitting: Physical Therapy

## 2014-12-04 NOTE — Telephone Encounter (Signed)
Contacted patient to discuss no return to PT since 11/02/14. Pt reporting that memory has become increasingly worse. Also reported episode of dizziness and vomiting within the past week; described needing to be physically carried home by family/friends due to severity of symptoms. Strongly recommending pt contact neurologist, Dr. Jannifer Franklin, to discuss change in status. Gave patient neurologist's phone number.  Pt planning to call back to schedule future PT appointments "if I can remember," per pt. May need to follow up on this.   Billie Ruddy, PT, DPT [facility] [address] [place], [place], [address] Phone: [phone]   Fax:  [phone] 12/04/2014, 9:21 AM

## 2014-12-10 ENCOUNTER — Ambulatory Visit: Payer: PPO | Attending: Neurology | Admitting: Physical Therapy

## 2014-12-10 DIAGNOSIS — R42 Dizziness and giddiness: Secondary | ICD-10-CM | POA: Diagnosis not present

## 2014-12-10 DIAGNOSIS — R278 Other lack of coordination: Secondary | ICD-10-CM | POA: Insufficient documentation

## 2014-12-10 DIAGNOSIS — R269 Unspecified abnormalities of gait and mobility: Secondary | ICD-10-CM | POA: Insufficient documentation

## 2014-12-10 DIAGNOSIS — R2681 Unsteadiness on feet: Secondary | ICD-10-CM | POA: Diagnosis present

## 2014-12-10 NOTE — Patient Instructions (Addendum)
SINGLE LIMB STANCE   Stance: single leg on floor. Raise leg. Hold _10__ seconds. Repeat with other leg. __1_ reps per set, 1-2___ sets per day, _5__ days per week  Copyright  VHI. All rights reserved.  Feet Apart (Compliant Surface) Head Motion - Eyes Closed   Stand on compliant surface: ____PILLOW____ with feet shoulder width apart. Close eyes and move head slowly, up and down. Repeat _8-10___ times per session. Do _1-2___ sessions per day.  Also stand on pillow with eyes closed with feet together - hold approx. 30 secs -- Do 1-2 times/day.  Copyright  VHI. All rights reserved.  Feet Together (Compliant Surface) Varied Arm Positions - Eyes Open   With eyes open, standing on compliant surface: __PILLOW______, feet together and arms out, look at a stationary object. Hold __30__ seconds. Repeat _1___ times per session. Do __1-2__ sessions per day.  Also stand with feet apart - eyes open - turn head to look at targets side to side and  Up and down -- approx. 8-10 times each direction  1-2 times/day.  Copyright  VHI. All rights reserved.  Gaze Stabilization: Standing Feet Apart   Feet shoulder width apart, keeping eyes on target on wall __6-8__ feet away, move head side to side for _60___ seconds. Repeat while moving head up and down for  60____ seconds. Do _3___ sessions per day. Repeat using target on pattern background.  Copyright  VHI. All rights reserved.  Gaze Stabilization: Tip Card 1.Target must remain in focus, not blurry, and appear stationary while head is in motion. 2.Perform exercises with small head movements (45 to either side of midline). 3.Increase speed of head motion so long as target is in focus. 4.If you wear eyeglasses, be sure you can see target through lens (therapist will give specific instructions for bifocal / progressive lenses). 5.These exercises may provoke dizziness or nausea. Work through these symptoms. If too dizzy, slow head movement slightly.  Rest between each exercise. 6.Exercises demand concentration; avoid distractions. 7.For safety, perform standing exercises close to a counter, wall, corner, or next to someone.  Copyright  VHI. All rights reserved.

## 2014-12-11 ENCOUNTER — Encounter: Payer: Self-pay | Admitting: Physical Therapy

## 2014-12-11 NOTE — Therapy (Signed)
[facility] [address] [place], [place], [address] Phone: [phone]   Fax:  [phone]  Physical Therapy Treatment  Patient Details  Name: Laura Pollard MRN: [id number] Date of Birth: Dec 05, 1940 Referring Provider:  Antony Blackbird, MD  Encounter Date: 12/10/2014      PT End of Session - 12/11/14 0958    Visit Number 3  G3   Number of Visits 9   Date for PT Re-Evaluation 12/14/14   Authorization Type Medicare - G Codes every 10th visit   PT Start Time 7253   PT Stop Time 1623   PT Time Calculation (min) 48 min      Past Medical History  Diagnosis Date  . Atypical chest pain     normal ETT/Echo in 2003.. card cath normal 2010  . Hypercholesteremia   . Anxiety   . Multiple allergies   . Hyperlipidemia   . MVP (mitral valve prolapse)   . Arthritis   . Scoliosis   . Headache   . Liver disease   . Abnormal heart rhythm   . 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 Nocturnal leg cramps 09/14/2014  . Depression     Past Surgical History  Procedure Laterality Date  . Cardiac catheterization  2010    normal  . Ett  2003    also ECHO..normal  . Ovarian cyst surgery    . Tonsillectomy    . Cesarean section    . Cataract surgery    . Colonoscopy    . Retinal detachment surgery Right 10 years ago    There were no vitals filed for this visit.  Visit Diagnosis:  Dizziness and giddiness  Abnormality of gait      Subjective Assessment - 12/11/14 0943    Subjective Pt. reports no true dizziness but more unsteadiness and balance problems; states she has had more stress with work requirements/goals and has also recently received some bad news regarding family and wonders if this could possibly be related to her problems   Pertinent History Vertebrobasilar Artery Insufficiency (VBI) - Avoid endrange cervical spine extension; Anxiety   Currently in Pain? No/denies                               Balance Exercises - 12/11/14 0948    Balance Exercises: Standing   Standing Eyes Opened Wide (BOA);Narrow base of support (BOS);Foam/compliant surface;Head turns;10 secs   Standing Eyes Closed Narrow base of support (BOS);Wide (BOA);Head turns;Foam/compliant surface   SLS Eyes open;2 reps;Solid surface;10 secs  UE support needed   Turning Right;Left;3 reps  added step and turn toward each side with sitting in between     Issued HEP consisting of standing on foam with EC and EO with head turns and with targets when eyes open for improved Gaze stabilization. Pt. Performed single limb stance with RLE and LLE 10 sec hold with UE support prn; performed VOR x 1 in standing With horizontal head turns x 20 secs; with vertical head turns x 20 secs. Pt. C/o nausea with horizontal head turns and requested  to stop exercise due to not feeling well turning ex for habituation - pt performed sit to stand with step & turn to R and L sides x 5 reps each with SBA - mild to moderate c/o vertigo with this activity      PT Education - 12/11/14 0955    Education provided Yes  Education Details reissued pics of standing on foam as pt stated she had lost her copy; gave EO and EC with feet apart and feet together with head turns/targets; also gave SLS and gaze stabilization   Person(s) Educated Patient   Methods Explanation;Demonstration;Handout   Comprehension Verbalized understanding;Returned demonstration;Verbal cues required          PT Short Term Goals - 11/02/14 1255    PT SHORT TERM GOAL #1   Title Pt will demonstrate HEP with mod I using paper handout to maximize functional gains made in physical therapy. Target date: 74//16.   Status On-going   PT SHORT TERM GOAL #2   Title Pt will verbalize understanding of fall prevention strategies to decrease risk of falling within home. Target date: 11/12/14.   Status On-going   PT SHORT TERM GOAL #3   Title Pt will score 16/24 on Dynamic Gait Index to  demonstrate improved gait stability in the presence of external demands. Target date: 11/12/14.   Status On-going           PT Long Term Goals - 11/02/14 1256    PT LONG TERM GOAL #1   Title Pt will score 20/24 on Dynamic Gait Index to demonstrate decreased risk of falling. Target date: 12/10/14.   Status On-going   PT LONG TERM GOAL #2   Title Pt will ambulate >500' over level/unlevel surfaces with mod I using LRAD to demonstrate safety with community mobility. Target date: 12/10/14.   Status On-going   PT LONG TERM GOAL #3   Title Pt will demonstrate use of 2 compensatory strategies for dizziness during functional mobility without cueing to increase pt safety with functional mobility. Target date: 12/10/14.   Status On-going               Plan - 12/11/14 0959    Clinical Impression Statement Pt. has decreased vestibular function as noted by increased sway/some LOB with EC and with head turns; pt. also reported nausea with gaze stabilization exercise with horizontal worse than vertical; also noted that pt has significantly decr. high level balance skills including decr. SLS and decr. tandem stance; L SLS is slightly better than R SLS                                                                                                                                                                                                               Pt will benefit from skilled therapeutic intervention in order to improve on the following deficits Abnormal gait;Decreased balance;Decreased  cognition;Decreased mobility;Decreased knowledge of use of DME;Decreased coordination;Decreased safety awareness;Postural dysfunction;Dizziness;Decreased activity tolerance;Impaired perceived functional ability   Rehab Potential Good   Clinical Impairments Affecting Rehab Potential Cognitive impairments   PT Frequency 1x / week   PT Duration 8 weeks   PT Treatment/Interventions ADLs/Self Care Home  Management;Vestibular;Functional mobility training;Stair training;Gait training;DME Instruction;Therapeutic activities;Therapeutic exercise;Balance training;Neuromuscular re-education;Cognitive remediation;Patient/family education;Manual techniques   PT Next Visit Plan Complete Medicare renewal next visit - pt. has only attended 2 sessions since initial eval; check HEP given on 12-10-14; cont vestibular/dynamic gait activities   PT Home Exercise Plan Balance on foam with EO and EC - with targets with EO and head turns with EC; SLS; gaze stabilization in standing-plain background - gradually incr. time as tolerated   Consulted and Agree with Plan of Care Patient        Problem List Patient Active Problem List   Diagnosis Date Noted  . 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 Dizziness and giddiness 09/14/2014  . Nocturnal leg cramps 09/14/2014    Prince Couey, [place], PT 12/11/2014, 10:08 AM  [facility] [address] [place], [place], [address] Phone: [phone]   Fax:  [phone]

## 2014-12-17 ENCOUNTER — Ambulatory Visit: Payer: PPO | Admitting: Physical Therapy

## 2014-12-17 DIAGNOSIS — R278 Other lack of coordination: Secondary | ICD-10-CM

## 2014-12-17 DIAGNOSIS — R42 Dizziness and giddiness: Secondary | ICD-10-CM | POA: Diagnosis not present

## 2014-12-17 DIAGNOSIS — R269 Unspecified abnormalities of gait and mobility: Secondary | ICD-10-CM

## 2014-12-17 NOTE — Therapy (Signed)
[facility] [address] [place], [place], [address] Phone: [phone]   Fax:  [phone]  Physical Therapy Treatment  Patient Details  Name: Laura Pollard MRN: [id number] Date of Birth: 09/08/1940 Referring Provider:  Antony Blackbird, MD  Encounter Date: 12/17/2014      PT End of Session - 12/17/14 2014    Visit Number 4   Number of Visits 12   Date for PT Re-Evaluation 02/15/15   Authorization Type Medicare - G Codes every 10th visit   PT Start Time 1449   PT Stop Time 1535   PT Time Calculation (min) 46 min   Equipment Utilized During Treatment Gait belt   Activity Tolerance Patient tolerated treatment well   Behavior During Therapy Anxious      Past Medical History  Diagnosis Date  . Atypical chest pain     normal ETT/Echo in 2003.. card cath normal 2010  . Hypercholesteremia   . Anxiety   . Multiple allergies   . Hyperlipidemia   . MVP (mitral valve prolapse)   . Arthritis   . Scoliosis   . Headache   . Liver disease   . Abnormal heart rhythm   . 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 Nocturnal leg cramps 09/14/2014  . Depression     Past Surgical History  Procedure Laterality Date  . Cardiac catheterization  2010    normal  . Ett  2003    also ECHO..normal  . Ovarian cyst surgery    . Tonsillectomy    . Cesarean section    . Cataract surgery    . Colonoscopy    . Retinal detachment surgery Right 10 years ago    There were no vitals filed for this visit.  Visit Diagnosis:  Dizziness and giddiness  Abnormality of gait  Abnormal coordination      Subjective Assessment - 12/17/14 1456    Subjective Pt reports she continues to experience sporatic episodes of nausea. She states, "Two weeks ago, I had a very bad episode at work. My husband had to come pick me up and I was vomiting on ths way home."   Pertinent History Vertebrobasilar Artery Insufficiency (VBI) - Avoid endrange cervical spine  extension; Anxiety   Currently in Pain? No/denies                         [facility] Adult PT Treatment/Exercise - 12/17/14 0001    Ambulation/Gait   Ambulation/Gait Yes   Ambulation/Gait Assistance 5: Supervision;4: Min guard;4: Min assist   Ambulation/Gait Assistance Details supervision over level surfaces; min guard-min A over unlevel grass and paved surfaces   Ambulation Distance (Feet) 700 Feet  x200' indoors; x500' outdoors   Assistive device None   Gait Pattern Step-through pattern;Wide base of support;Lateral trunk lean to right   Ambulation Surface Level;Indoor;Unlevel;Outdoor;Paved;Grass  UE's in high guarding position w/ outdoor gait   Stairs Yes   Stairs Assistance 6: Modified independent (Device/Increase time)   Stair Management Technique Forwards;No rails;Alternating pattern   Number of Stairs 4   Height of Stairs 6   Gait Comments --   Standardized Balance Assessment   Standardized Balance Assessment Dynamic Gait Index   Dynamic Gait Index   Level Surface Mild Impairment   Change in Gait Speed Normal   Gait with Horizontal Head Turns Mild Impairment   Gait with Vertical Head Turns Moderate Impairment   Gait and Pivot Turn Mild Impairment   Step  Over Obstacle Mild Impairment   Step Around Obstacles Mild Impairment   Steps Normal   Total Score 17   Neuro Re-ed    Neuro Re-ed Details  Reviewed home exercises; pt required cueing for safe positioning in corner and for technique with gaze stabilization exercises. See Pt Instructions for details on all exercises, reps, frequency, and duration.                PT Education - 12/17/14 2006    Education provided Yes   Education Details Reviewed balance HEP. Educated pt on progress toward LTG's as well as POC.   Person(s) Educated Patient   Methods Explanation;Verbal cues   Comprehension Verbalized understanding;Returned demonstration          PT Short Term Goals - 12/17/14 2015    PT SHORT  TERM GOAL #1   Title Pt will demonstrate HEP with mod I using paper handout to maximize functional gains made in physical therapy. Modified Target date: 01/14/15.   Baseline 8/8: Pt required cueing for safe/proper technique with HEP, which was re-issued last session after pt inability to attend PT for >1 month.   Status Not Met   PT SHORT TERM GOAL #2   Title Pt will verbalize understanding of fall prevention strategies to decrease risk of falling within home. Modified Target date: 01/14/15.   Baseline 8/8: re-addressed fall prevention strategies due to pt recently returning to PT after 58-monthhiatus.   Status Not Met   PT SHORT TERM GOAL #3   Title Pt will score 16/24 on Dynamic Gait Index to demonstrate improved gait stability in the presence of external demands. Target date: 11/12/14.   Baseline 8/8: DGI score = 17/24   Status Achieved           PT Long Term Goals - 12/17/14 1513    PT LONG TERM GOAL #1   Title Pt will score 20/24 on Dynamic Gait Index to demonstrate decreased risk of falling. Modified Target date: 02/11/15.   Baseline 12/17/14: DGI score =17/24    Status Not Met  Continue goal through renewed POC   PT LONG TERM GOAL #2   Title Pt will ambulate >500' over level/unlevel surfaces with mod I using LRAD to demonstrate safety with community mobility. Modified Target date: 02/11/15.   Baseline 12/17/15: Pt required supervision to ambulate over level surfaces, min guard - min A over unlevel surfaces.   Status Not Met  Continue goal through renewed POC   PT LONG TERM GOAL #3   Title Pt will demonstrate use of 2 compensatory strategies for dizziness during functional mobility without cueing to increase pt safety with functional mobility. Target date: 12/10/14.   Status Not Met  Continue goal through renewed POC               Plan - 12/17/14 2026    Clinical Impression Statement Pt met 1 of 3 STG's and 0 of 3 LTG's, which is likely attributable to pt having attended only 4  visits since beginning this episode of outpatient PT. Pt did not attend outpatient PT for >1 month due to work obligations; however, pt has currently re-arranged work schedule to enable her to attend PT 1x/week for the next 8 weeks. PT sessions will continue to focus on initial short and long-term goals. Pt verbalized understanding and was in full agreement with POC.   Pt will benefit from skilled therapeutic intervention in order to improve on the following deficits Abnormal gait;Decreased balance;Decreased cognition;Decreased  mobility;Decreased knowledge of use of DME;Decreased coordination;Decreased safety awareness;Postural dysfunction;Dizziness;Decreased activity tolerance;Impaired perceived functional ability   Rehab Potential Good   Clinical Impairments Affecting Rehab Potential Cognitive impairments   PT Frequency 1x / week   PT Duration 8 weeks   PT Treatment/Interventions ADLs/Self Care Home Management;Vestibular;Functional mobility training;Stair training;Gait training;DME Instruction;Therapeutic activities;Therapeutic exercise;Balance training;Neuromuscular re-education;Cognitive remediation;Patient/family education;Manual techniques   PT Home Exercise Plan Balance on foam with EO and EC - with targets with EO and head turns with EC; SLS; gaze stabilization in standing-plain background - gradually incr. time as tolerated   Consulted and Agree with Plan of Care Patient        Problem List Patient Active Problem List   Diagnosis Date Noted  . 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 Dizziness and giddiness 09/14/2014  . Nocturnal leg cramps 09/14/2014    Billie Ruddy, PT, DPT [facility] [address] [place], [place], [address] Phone: [phone]   Fax:  [phone] 12/17/2014, 8:31 PM

## 2014-12-17 NOTE — Patient Instructions (Signed)
SINGLE LIMB STANCE   Stance: single leg on floor. Raise leg. Hold _10__ seconds. Repeat with other leg. __1_ reps per set, 1-2___ sets per day, _5__ days per week  Copyright  VHI. All rights reserved.  Feet Apart (Compliant Surface) Head Motion - Eyes Closed   Stand on compliant surface: ____PILLOW____ with feet shoulder width apart. Close eyes and move head slowly, up and down. Repeat _8-10___ times per session. Do _1-2___ sessions per day. Also stand on pillow with eyes closed with feet together - hold approx. 30 secs -- Do 1-2 times/day.  Copyright  VHI. All rights reserved.  Feet Together (Compliant Surface) Varied Arm Positions - Eyes Open   With eyes open, standing on compliant surface: __PILLOW______, feet together and arms out, look at a stationary object. Hold __30__ seconds. Repeat _1___ times per session. Do __1-2__ sessions per day. Also stand with feet apart - eyes open - turn head to look at targets side to side and  Up and down -- approx. 8-10 times each direction 1-2 times/day.  Copyright  VHI. All rights reserved.  Gaze Stabilization: Standing Feet Apart   Feet shoulder width apart, keeping eyes on target on wall __6-8__ feet away, move head side to side for _60___ seconds. Repeat while moving head up and down for 60____ seconds. Do _3___ sessions per day. Repeat using target on pattern background.  Copyright  VHI. All rights reserved.  Gaze Stabilization: Tip Card 1.Target must remain in focus, not blurry, and appear stationary while head is in motion. 2.Perform exercises with small head movements (45 to either side of midline). 3.Increase speed of head motion so long as target is in focus. 4.If you wear eyeglasses, be sure you can see target through lens (therapist will give specific instructions for bifocal / progressive lenses). 5.These exercises may provoke dizziness or nausea. Work through these symptoms. If too dizzy, slow head movement  slightly. Rest between each exercise. 6.Exercises demand concentration; avoid distractions. 7.For safety, perform standing exercises close to a counter, wall, corner, or next to someone.

## 2014-12-17 NOTE — Addendum Note (Signed)
Addended by: Billie Ruddy A on: 12/17/2014 08:36 PM   Modules accepted: Orders

## 2014-12-24 ENCOUNTER — Ambulatory Visit: Payer: PPO | Admitting: Physical Therapy

## 2014-12-24 DIAGNOSIS — R2681 Unsteadiness on feet: Secondary | ICD-10-CM

## 2014-12-24 DIAGNOSIS — R42 Dizziness and giddiness: Secondary | ICD-10-CM | POA: Diagnosis not present

## 2014-12-24 DIAGNOSIS — R269 Unspecified abnormalities of gait and mobility: Secondary | ICD-10-CM

## 2014-12-24 NOTE — Patient Instructions (Addendum)
SINGLE LIMB STANCE   Stance: single leg on floor. Raise leg. Hold _10__ seconds. Repeat with other leg. __1_ reps per set, 1-2___ sets per day, _5__ days per week  Copyright  VHI. All rights reserved.  Feet Apart (Compliant Surface) Head Motion - Eyes Closed   Stand on compliant surface: ____PILLOW____ with feet shoulder width apart. Close eyes and move head slowly, up and down; right to left; up/right to down/left; and up/left to down/right. Do 10 head turns per direction. Do this 2 times per day.  Copyright  VHI. All rights reserved.  Feet Together (Compliant Surface) Varied Arm Positions - Eyes Open   With eyes open, standing on compliant surface: __PILLOW______, feet together and arms out, look at a stationary object. Hold __30__ seconds.   Gaze Stabilization: Standing Feet Apart   Feet shoulder width apart, keeping eyes on target on wall __6-8__ feet away, move head side to side for _60___ seconds. Repeat while moving head up and down for 60____ seconds. Do _3___ sessions per day. Repeat using target on pattern background.  Copyright  VHI. All rights reserved.  Gaze Stabilization: Tip Card 1.Target must remain in focus, not blurry, and appear stationary while head is in motion. 2.Perform exercises with small head movements (45 to either side of midline). 3.Increase speed of head motion so long as target is in focus. 4.If you wear eyeglasses, be sure you can see target through lens (therapist will give specific instructions for bifocal / progressive lenses). 5.These exercises may provoke dizziness or nausea. Work through these symptoms. If too dizzy, slow head movement slightly. Rest between each exercise. 6.Exercises demand concentration; avoid distractions. 7.For safety, perform standing exercises close to a counter, wall, corner, or next to someone.     Gaze Fixation - Compensatory Strategy 2   While walking: 1) move eyes to a stationary target, then 2)  keeping eyes on target, turn head in same direction. Repeat sequence in opposite direction.  Don't do this as a formal exercise. Instead, think about findings a stationary object to look at with your eyes when you turn while walking.

## 2014-12-24 NOTE — Patient Instructions (Signed)
SINGLE LIMB STANCE   Stance: single leg on floor. Raise leg. Hold _10__ seconds. Repeat with other leg. __1_ reps per set, 1-2___ sets per day, _5__ days per week  Copyright  VHI. All rights reserved.  Feet Apart (Compliant Surface) Head Motion - Eyes Closed   Stand on compliant surface: ____PILLOW____ with feet shoulder width apart. Close eyes and move head slowly, up and down; right to left; up/right to down/left; and up/left to down/right. Do 10 head turns per direction. Do this 2 times per day.  Copyright  VHI. All rights reserved.  Feet Together (Compliant Surface) Varied Arm Positions - Eyes Open   With eyes open, standing on compliant surface: __PILLOW______, feet together and arms out, look at a stationary object. Hold __30__ seconds.   Gaze Stabilization: Standing Feet Apart   Feet shoulder width apart, keeping eyes on target on wall __6-8__ feet away, move head side to side for _60___ seconds. Repeat while moving head up and down for 60____ seconds. Do _3___ sessions per day. Repeat using target on pattern background.  Copyright  VHI. All rights reserved.  Gaze Stabilization: Tip Card 1.Target must remain in focus, not blurry, and appear stationary while head is in motion. 2.Perform exercises with small head movements (45 to either side of midline). 3.Increase speed of head motion so long as target is in focus. 4.If you wear eyeglasses, be sure you can see target through lens (therapist will give specific instructions for bifocal / progressive lenses). 5.These exercises may provoke dizziness or nausea. Work through these symptoms. If too dizzy, slow head movement slightly. Rest between each exercise. 6.Exercises demand concentration; avoid distractions. 7.For safety, perform standing exercises close to a counter, wall, corner, or next to someone.     Gaze Fixation - Compensatory Strategy 2   While walking: 1) move eyes to a stationary target, then 2)  keeping eyes on target, turn head in same direction. Repeat sequence in opposite direction.  Don't do this as a formal exercise. Instead, think about findings a stationary object to look at with your eyes when you turn while walking.

## 2014-12-24 NOTE — Therapy (Signed)
[facility] [address] [place], [place], [address] Phone: [phone]   Fax:  [phone]  Physical Therapy Treatment  Patient Details  Name: Laura Pollard MRN: [id number] Date of Birth: 1940-11-24 Referring Provider:  Antony Blackbird, MD  Encounter Date: 12/24/2014      PT End of Session - 12/24/14 1633    Visit Number 5   Number of Visits 12   Date for PT Re-Evaluation 02/15/15   Authorization Type Medicare - G Codes every 10th visit   PT Start Time 1532   PT Stop Time 1618   PT Time Calculation (min) 46 min   Equipment Utilized During Treatment Gait belt   Activity Tolerance Patient tolerated treatment well   Behavior During Therapy Anxious      Past Medical History  Diagnosis Date  . Atypical chest pain     normal ETT/Echo in 2003.. card cath normal 2010  . Hypercholesteremia   . Anxiety   . Multiple allergies   . Hyperlipidemia   . MVP (mitral valve prolapse)   . Arthritis   . Scoliosis   . Headache   . Liver disease   . Abnormal heart rhythm   . 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 Nocturnal leg cramps 09/14/2014  . Depression     Past Surgical History  Procedure Laterality Date  . Cardiac catheterization  2010    normal  . Ett  2003    also ECHO..normal  . Ovarian cyst surgery    . Tonsillectomy    . Cesarean section    . Cataract surgery    . Colonoscopy    . Retinal detachment surgery Right 10 years ago    There were no vitals filed for this visit.  Visit Diagnosis:  Abnormality of gait  Unsteadiness  Dizziness and giddiness      Subjective Assessment - 12/24/14 1535    Subjective Pt states, "I need some help with those home exercises. I'm not sure if I'm doing them right." Pt also states,"I don't know if it's the weather or what, but I get so hot and perspire so much at work when I'm moving around too fast."  Pt reports no more episodes of vomiting.   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 No/denies                         [facility] Adult PT Treatment/Exercise - 12/24/14 0001    Ambulation/Gait   Ambulation/Gait Yes   Ambulation/Gait Assistance 5: Supervision;4: Min guard;4: Min assist   Ambulation Distance (Feet) 500 Feet   Assistive device None   Gait Pattern Step-through pattern;Wide base of support;Lateral trunk lean to right;Scissoring  RLE scissoring x1 episode   Ambulation Surface Level;Indoor   Stairs No   Stairs Assistance --   Stair Management Technique --   Number of Stairs --   Height of Stairs --   Gait Comments During gait, cued pt to visually focus on target during turning to improve gait stability.   Standardized Balance Assessment   Standardized Balance Assessment --   Neuro Re-ed    Neuro Re-ed Details  Reviewed home exercises, per pt request. Pt required cueing for technique with VOR x1 viewing. Added diagonal head turns to corner balance exercise on pillow. See Pt Instructions for further detail.         Vestibular Treatment/Exercise - 12/24/14  0001    Vestibular Treatment/Exercise   Vestibular Treatment Provided Gaze   Gaze Exercises X1 Viewing Horizontal;X1 Viewing Vertical;Eye/Head Exercise Horizontal;Comment   X1 Viewing Horizontal   Foot Position shoulder width   Comments 60 seconds; cueing to maintain eyes on target, to continue for entire duration   X1 Viewing Vertical   Foot Position shoulder width   Comments 60 seconds; cueing to maintain eyes on target, to continue for entire duration   Eye/Head Exercise Horizontal   Foot Position shoulder width   Reps 15   Comments with visual target            Balance Exercises - 12/24/14 1630    Balance Exercises: Standing   Standing Eyes Opened Narrow base of support (BOS);Foam/compliant surface;30 secs   Standing Eyes Closed Wide (BOA);Head  turns;Foam/compliant surface;Other reps (comment)  10 reps horizontal, vertical, diagonal head turns   SLS Eyes open;10 secs;2 reps             PT Short Term Goals - 12/17/14 2015    PT SHORT TERM GOAL #1   Title Pt will demonstrate HEP with mod I using paper handout to maximize functional gains made in physical therapy. Modified Target date: 01/14/15.   Baseline 8/8: Pt required cueing for safe/proper technique with HEP, which was re-issued last session after pt inability to attend PT for >1 month.   Status Not Met   PT SHORT TERM GOAL #2   Title Pt will verbalize understanding of fall prevention strategies to decrease risk of falling within home. Modified Target date: 01/14/15.   Baseline 8/8: re-addressed fall prevention strategies due to pt recently returning to PT after 14-monthhiatus.   Status Not Met   PT SHORT TERM GOAL #3   Title Pt will score 16/24 on Dynamic Gait Index to demonstrate improved gait stability in the presence of external demands. Target date: 11/12/14.   Baseline 8/8: DGI score = 17/24   Status Achieved           PT Long Term Goals - 12/17/14 1513    PT LONG TERM GOAL #1   Title Pt will score 20/24 on Dynamic Gait Index to demonstrate decreased risk of falling. Modified Target date: 02/11/15.   Baseline 12/17/14: DGI score =17/24    Status Not Met  Continue goal through renewed POC   PT LONG TERM GOAL #2   Title Pt will ambulate >500' over level/unlevel surfaces with mod I using LRAD to demonstrate safety with community mobility. Modified Target date: 02/11/15.   Baseline 12/17/15: Pt required supervision to ambulate over level surfaces, min guard - min A over unlevel surfaces.   Status Not Met  Continue goal through renewed POC   PT LONG TERM GOAL #3   Title Pt will demonstrate use of 2 compensatory strategies for dizziness during functional mobility without cueing to increase pt safety with functional mobility. Target date: 12/10/14.   Status Not Met  Continue  goal through renewed POC               Plan - 12/24/14 1633    Clinical Impression Statement Session focused on standing balance, use of compensatory strategy for impaired VOR to improve gait stability during turning. Continue per POC.   Pt will benefit from skilled therapeutic intervention in order to improve on the following deficits Abnormal gait;Decreased balance;Decreased cognition;Decreased mobility;Decreased knowledge of use of DME;Decreased coordination;Decreased safety awareness;Postural dysfunction;Dizziness;Decreased activity tolerance;Impaired perceived functional ability   Rehab Potential Good  Clinical Impairments Affecting Rehab Potential Cognitive impairments   PT Frequency 1x / week   PT Duration 8 weeks   PT Treatment/Interventions ADLs/Self Care Home Management;Vestibular;Functional mobility training;Stair training;Gait training;DME Instruction;Therapeutic activities;Therapeutic exercise;Balance training;Neuromuscular re-education;Cognitive remediation;Patient/family education;Manual techniques   PT Next Visit Plan Check for carryover of compensatory strategy to use with turning. Progress home exercise by adding additional pillow.   Consulted and Agree with Plan of Care Patient        Problem List Patient Active Problem List   Diagnosis Date Noted  . 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 Dizziness and giddiness 09/14/2014  . Nocturnal leg cramps 09/14/2014    Billie Ruddy, PT, DPT [facility] [address] [place], [place], [address] Phone: [phone]   Fax:  [phone] 12/24/2014, 4:36 PM

## 2014-12-31 ENCOUNTER — Ambulatory Visit: Payer: PPO | Admitting: Physical Therapy

## 2014-12-31 VITALS — BP 136/66 | HR 61

## 2014-12-31 DIAGNOSIS — R42 Dizziness and giddiness: Secondary | ICD-10-CM

## 2014-12-31 DIAGNOSIS — R2681 Unsteadiness on feet: Secondary | ICD-10-CM

## 2014-12-31 DIAGNOSIS — R269 Unspecified abnormalities of gait and mobility: Secondary | ICD-10-CM

## 2014-12-31 NOTE — Therapy (Signed)
[facility] [address] [place], [place], [address] Phone: [phone]   Fax:  [phone]  Physical Therapy Treatment  Patient Details  Name: Laura Pollard MRN: [id number] Date of Birth: 04/05/1941 Referring Provider:  Antony Blackbird, MD  Encounter Date: 12/31/2014      PT End of Session - 12/31/14 1637    Visit Number 6   Number of Visits 12   Date for PT Re-Evaluation 02/15/15   Authorization Type Medicare - G Codes every 10th visit   PT Start Time 0933   PT Stop Time 1016   PT Time Calculation (min) 43 min   Equipment Utilized During Treatment Gait belt   Activity Tolerance Patient tolerated treatment well   Behavior During Therapy Anxious      Past Medical History  Diagnosis Date  . Atypical chest pain     normal ETT/Echo in 2003.. card cath normal 2010  . Hypercholesteremia   . Anxiety   . Multiple allergies   . Hyperlipidemia   . MVP (mitral valve prolapse)   . Arthritis   . Scoliosis   . Headache   . Liver disease   . Abnormal heart rhythm   . 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 Nocturnal leg cramps 09/14/2014  . Depression     Past Surgical History  Procedure Laterality Date  . Cardiac catheterization  2010    normal  . Ett  2003    also ECHO..normal  . Ovarian cyst surgery    . Tonsillectomy    . Cesarean section    . Cataract surgery    . Colonoscopy    . 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 No/denies                         [facility] Adult PT Treatment/Exercise - 12/31/14  0001    Ambulation/Gait   Ambulation/Gait Yes   Ambulation/Gait Assistance 5: Supervision;4: Min guard;4: Min assist   Ambulation Distance (Feet) 450 Feet   Assistive device None   Gait Pattern Step-through pattern;Wide base of support;Lateral trunk lean to right  RLE scissoring x1 episode   Ambulation Surface Level;Indoor   Stairs No   Gait Comments During gait, cued pt to visually focus on target during turning to improve gait stability.   Posture/Postural Control   Posture/Postural Control Postural limitations   Postural Limitations Forward head   Posture Comments noted en bloc turning (head and trunk turning simultaneously)   Neuro Re-ed    Neuro Re-ed Details  Educated pt on sensory reorganization technique to mitigate dizziness (which increased from 0/10 to 4/10 with head turns on rocker board). Difficult to discern if technique was effective due inconsistent pt report.   Exercises   Exercises Other Exercises   Other Exercises  Explained, demonstrated, and provided paper handout for cervical retraction due to pt-reported discomfort with turning as reason for avoiding horixontal head turns.         Vestibular Treatment/Exercise - 12/31/14 0001    Vestibular Treatment/Exercise  Vestibular Treatment Provided Gaze   Gaze Exercises X1 Viewing Horizontal;X1 Viewing Vertical   X1 Viewing Horizontal   Foot Position shoulder width   Comments 60 seconds; no cueing required for safe/proper performance   X1 Viewing Vertical   Foot Position shoulder width   Comments 60 seconds; no cueing required for safe/proper performance            Balance Exercises - 12/31/14 1303    Balance Exercises: Standing   Standing Eyes Opened Head turns;5 reps;Other (comment)  on large rocker board; vertical, horizontal, diagonal   SLS Eyes open;Solid surface;10 secs  Able to perform for up to 10 sec without LOB           PT Education - 12/31/14 1301    Education provided Yes   Education  Details Cervical retraction for improved neck posture, decreased discomfort during head turns. Recommending pt make MD aware of ongoing c/o of feeling "hot" and "clammy". Sensory reorganization to mitigate dizziness.   Person(s) Educated Patient   Methods Explanation;Demonstration;Verbal cues;Handout;Tactile cues   Comprehension Verbalized understanding;Returned demonstration          PT Short Term Goals - 12/31/14 1652    PT SHORT TERM GOAL #1   Title Pt will demonstrate HEP with mod I using paper handout to maximize functional gains made in physical therapy. Modified Target date: 01/14/15.   Baseline Achieved 8/22.   Status Achieved   PT SHORT TERM GOAL #2   Title Pt will verbalize understanding of fall prevention strategies to decrease risk of falling within home. Modified Target date: 01/14/15.   Baseline 8/8: re-addressed fall prevention strategies due to pt recently returning to PT after 59-monthhiatus.   Status Not Met   PT SHORT TERM GOAL #3   Title Pt will score 16/24 on Dynamic Gait Index to demonstrate improved gait stability in the presence of external demands. Target date: 11/12/14.   Baseline 8/8: DGI score = 17/24   Status Achieved           PT Long Term Goals - 12/17/14 1513    PT LONG TERM GOAL #1   Title Pt will score 20/24 on Dynamic Gait Index to demonstrate decreased risk of falling. Modified Target date: 02/11/15.   Baseline 12/17/14: DGI score =17/24    Status Not Met  Continue goal through renewed POC   PT LONG TERM GOAL #2   Title Pt will ambulate >500' over level/unlevel surfaces with mod I using LRAD to demonstrate safety with community mobility. Modified Target date: 02/11/15.   Baseline 12/17/15: Pt required supervision to ambulate over level surfaces, min guard - min A over unlevel surfaces.   Status Not Met  Continue goal through renewed POC   PT LONG TERM GOAL #3   Title Pt will demonstrate use of 2 compensatory strategies for dizziness during functional  mobility without cueing to increase pt safety with functional mobility. Target date: 12/10/14.   Status Not Met  Continue goal through renewed POC               Plan - 12/31/14 1638    Clinical Impression Statement Pt demonstrated ineffective between-session carryover of compensatory strategy (gaze fixation, eyes then head when turning) during gait. Pt demonstrates en bloc turning of head and trunk together; appeared to be secondary to fear of head movement vs. incoordination; however, pt reporting that this was due to cervical spine discomfort. Added cervical retraction exercise due to forward head posture. Continue per POC.  Pt will benefit from skilled therapeutic intervention in order to improve on the following deficits Abnormal gait;Decreased balance;Decreased cognition;Decreased mobility;Decreased knowledge of use of DME;Decreased coordination;Decreased safety awareness;Postural dysfunction;Dizziness;Decreased activity tolerance;Impaired perceived functional ability   Rehab Potential Good   Clinical Impairments Affecting Rehab Potential Cognitive impairments   PT Frequency 1x / week   PT Duration 8 weeks   PT Treatment/Interventions ADLs/Self Care Home Management;Vestibular;Functional mobility training;Stair training;Gait training;DME Instruction;Therapeutic activities;Therapeutic exercise;Balance training;Neuromuscular re-education;Cognitive remediation;Patient/family education;Manual techniques   PT Next Visit Plan Check for carryover of compensatory strategies. Ask if sensory reorganization was effective for mitigating dizziness at work.   Consulted and Agree with Plan of Care Patient        Problem List Patient Active Problem List   Diagnosis Date Noted  . 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 Dizziness and giddiness 09/14/2014  . Nocturnal leg cramps 09/14/2014    Billie Ruddy, PT, DPT [facility] [address] [place], [place],  [address] Phone: [phone]   Fax:  [phone] 12/31/2014, 5:01 PM

## 2015-01-07 ENCOUNTER — Ambulatory Visit: Payer: PPO | Admitting: Physical Therapy

## 2015-01-07 DIAGNOSIS — R2681 Unsteadiness on feet: Secondary | ICD-10-CM

## 2015-01-07 DIAGNOSIS — R42 Dizziness and giddiness: Secondary | ICD-10-CM

## 2015-01-07 DIAGNOSIS — R269 Unspecified abnormalities of gait and mobility: Secondary | ICD-10-CM

## 2015-01-07 NOTE — Patient Instructions (Addendum)
SINGLE LIMB STANCE   Stance: single leg on floor. Raise leg. Hold _10__ seconds. Repeat with other leg. __1_ reps per set, 1-2___ sets per day, _5__ days per week  Copyright  VHI. All rights reserved.  Feet Apart (Compliant Surface) Head Motion - Eyes Closed   Stand on compliant surface: ____PILLOW____ with feet shoulder width apart. Close eyes and move head slowly from right to left 10 times per direction. Do this 2 times per day.  Copyright  VHI. All rights reserved.  Feet Together (Compliant Surface) Varied Arm Positions - Eyes Open   With eyes open, standing on compliant surface: __PILLOW______, feet together and arms out, look at a stationary object. Hold __30__ seconds.   Gaze Stabilization: Standing Feet Apart   Feet shoulder width apart, keeping eyes on target on wall __6-8__ feet away, move head side to side for _60___ seconds.  Do _3___ sessions per day.  Gaze Stabilization: Tip Card 1.Target must remain in focus, not blurry, and appear stationary while head is in motion. 2.Perform exercises with small head movements (45 to either side of midline). 3.Increase speed of head motion so long as target is in focus. 4.If you wear eyeglasses, be sure you can see target through lens (therapist will give specific instructions for bifocal / progressive lenses). 5.These exercises may provoke dizziness or nausea. Work through these symptoms. If too dizzy, slow head movement slightly. Rest between each exercise. 6.Exercises demand concentration; avoid distractions. 7.For safety, perform standing exercises close to a counter, wall, corner, or next to someone.     If any of these exercises cause your dizziness to increase by more than 3 out of 10, stop the exercise, sit down, and rest until your dizziness subsides.

## 2015-01-07 NOTE — Therapy (Signed)
[facility] [address] [place], [place], [address] Phone: [phone]   Fax:  [phone]  Physical Therapy Treatment  Patient Details  Name: Laura Pollard MRN: [id number] Date of Birth: [birth year] Referring Provider:  Antony Blackbird, MD  Encounter Date: 01/07/2015      PT End of Session - 01/07/15 1858    Visit Number 7   Number of Visits 12   Date for PT Re-Evaluation 02/15/15   Authorization Type Medicare - G Codes every 10th visit   PT Start Time 1400   PT Stop Time 1445   PT Time Calculation (min) 45 min   Activity Tolerance Other (comment)  Limited by dizziness   Behavior During Therapy Anxious      Past Medical History  Diagnosis Date  . Atypical chest pain     normal ETT/Echo in 2003.. card cath normal 2010  . Hypercholesteremia   . Anxiety   . Multiple allergies   . Hyperlipidemia   . MVP (mitral valve prolapse)   . Arthritis   . Scoliosis   . Headache   . Liver disease   . Abnormal heart rhythm   . 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 Nocturnal leg cramps 09/14/2014  . Depression     Past Surgical History  Procedure Laterality Date  . Cardiac catheterization  2010    normal  . Ett  2003    also ECHO..normal  . Ovarian cyst surgery    . Tonsillectomy    . Cesarean section    . Cataract surgery    . Colonoscopy    . Retinal detachment surgery Right 10 years ago    There were no vitals filed for this visit.  Visit Diagnosis:  Dizziness and giddiness  Abnormality of gait  Unsteadiness      Subjective Assessment - 01/07/15 1833    Subjective "Those exercises make me feel really, really dizzy. I have a hard tme with the one where I'm turning my head. Sometimes I get so sick, I throw up." Upon further questioning about why HEP causes nausea/vomiting, pt replied, "Oh, that's probably just my post-nasal drip." Pt also reporting symptoms of "spinning" after getting OOB in morning. Pt inquiring  as to whether recent headaches are due to "patchy stuff" in sinuses found on dental x-ray.    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 Yes   Pain Score 3    Pain Location Head   Pain Orientation Anterior;Mid;Upper   Pain Descriptors / Indicators Headache   Pain Type Chronic pain   Pain Onset Today   Aggravating Factors  "I don't know. I don't normally get these."   Pain Relieving Factors "Laying down."   Multiple Pain Sites No                Vestibular Assessment - 01/07/15 0001    Vestibular Assessment   General Observation postural /gait instability with vertical (not end range) > horizontal head movements   Symptom Behavior   Type of Dizziness Imbalance  and headache. Describes "world moves" in morning only   Frequency of Dizziness "When I move too fast at work" or with home exercises involving head movement   Duration of Dizziness Dizziness seems cumulative, increasing progresively throughout session. Has increased from 3/10 to 8/10 with horizontal head movements x4. At other times.   Aggravating Factors Looking up to the ceiling;Mornings;Turning head  quickly   Relieving Factors No known relieving factors   Occulomotor Exam   Occulomotor Alignment Normal   Positional Testing   Sidelying Test Sidelying Right;Sidelying Left   Sidelying Right   Sidelying Right Symptoms No nystagmus;Other (comment)  asymptomatic   Sidelying Left   Sidelying Left Symptoms No nystagmus;Other (comment)  Pt states, "It feels like I'm starting to get double vision"                  Vestibular Treatment/Exercise - 01/07/15 0001    Vestibular Treatment/Exercise   Vestibular Treatment Provided Gaze   Gaze Exercises X1 Viewing Horizontal;X1 Viewing Vertical;Eye/Head Exercise Vertical   X1 Viewing Horizontal   Foot Position shoulder width   Comments 60  seconds; cueing for setup, proper performance of exercise.  subjective dizziness remained at baseline 3/10   X1 Viewing Vertical   Foot Position shoulder width   Comments 30 seconds; removed from HEP due to safety concerns   Eye/Head Exercise Vertical   Comments When performing home exercises involving vertical head turns, required intermittent cueing to avoid excessive cervical spine extension. After performing vertical head turns, pt reported no change in dizziness/disequilibrium from baseline (3/10); however, observed non-verbal signs of dizziness such as increased blinking postural instability, and significant posterior LOB.            Balance Exercises - 01/07/15 1851    Balance Exercises: Standing   Standing Eyes Opened Foam/compliant surface;Wide (BOA);Head turns  Removed vertical head turns due to safety concerns   SLS Eyes open;Solid surface;30 secs           PT Education - 01/07/15 1846    Education provided Yes   Education Details HEP: removed vertical head turns from all exercises due to safety concerns. Also removed gaze stabilization during gait to decrease pt confusion of this exercise with x1 viewing. Recommended pt stop home exercises and sit/lay down if dizziness, headache, nausea increase to >3/10 from baseline.   Person(s) Educated Patient   Methods Explanation;Demonstration;Verbal cues;Handout   Comprehension Verbalized understanding;Need further instruction;Returned demonstration          PT Short Term Goals - 12/31/14 1652    PT SHORT TERM GOAL #1   Title Pt will demonstrate HEP with mod I using paper handout to maximize functional gains made in physical therapy. Modified Target date: 01/14/15.   Baseline Achieved 8/22.   Status Achieved   PT SHORT TERM GOAL #2   Title Pt will verbalize understanding of fall prevention strategies to decrease risk of falling within home. Modified Target date: 01/14/15.   Baseline 8/8: re-addressed fall prevention  strategies due to pt recently returning to PT after 33-monthhiatus.   Status Not Met   PT SHORT TERM GOAL #3   Title Pt will score 16/24 on Dynamic Gait Index to demonstrate improved gait stability in the presence of external demands. Target date: 11/12/14.   Baseline 8/8: DGI score = 17/24   Status Achieved           PT Long Term Goals - 12/17/14 1513    PT LONG TERM GOAL #1   Title Pt will score 20/24 on Dynamic Gait Index to demonstrate decreased risk of falling. Modified Target date: 02/11/15.   Baseline 12/17/14: DGI score =17/24    Status Not Met  Continue goal through renewed POC   PT LONG TERM GOAL #2   Title Pt will ambulate >500' over level/unlevel surfaces with mod I using LRAD to demonstrate safety  with community mobility. Modified Target date: 02/11/15.   Baseline 12/17/15: Pt required supervision to ambulate over level surfaces, min guard - min A over unlevel surfaces.   Status Not Met  Continue goal through renewed POC   PT LONG TERM GOAL #3   Title Pt will demonstrate use of 2 compensatory strategies for dizziness during functional mobility without cueing to increase pt safety with functional mobility. Target date: 12/10/14.   Status Not Met  Continue goal through renewed POC               Plan - 01/07/15 1915    Clinical Impression Statement During current session, pt with increased anxiety; description of symptoms more difficult to follow/understanding, as compared with previous sessions. Continue to suspect cognitive impairments (referring MD aware), as pt exhibits poor between-session carryover of home exercises and repeats questions frequently. Will request for husband to accompany pt to next session. Continue per POC.   Pt will benefit from skilled therapeutic intervention in order to improve on the following deficits Abnormal gait;Decreased balance;Decreased cognition;Decreased mobility;Decreased knowledge of use of DME;Decreased coordination;Decreased safety  awareness;Postural dysfunction;Dizziness;Decreased activity tolerance;Impaired perceived functional ability   Rehab Potential Good   Clinical Impairments Affecting Rehab Potential Cognitive impairments   PT Frequency 1x / week   PT Duration 8 weeks   PT Treatment/Interventions ADLs/Self Care Home Management;Vestibular;Functional mobility training;Stair training;Gait training;DME Instruction;Therapeutic activities;Therapeutic exercise;Balance training;Neuromuscular re-education;Cognitive remediation;Patient/family education;Manual techniques   PT Next Visit Plan If husband present at next session, provide education on VBI, HEP, and compensatory strategies to promote carryover.   Consulted and Agree with Plan of Care Patient        Problem List Patient Active Problem List   Diagnosis Date Noted  . 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 Dizziness and giddiness 09/14/2014  . Nocturnal leg cramps 09/14/2014    Billie Ruddy, PT, DPT [facility] [address] [place], [place], [address] Phone: [phone]   Fax:  [phone] 01/07/2015, 7:22 PM

## 2015-01-15 ENCOUNTER — Ambulatory Visit: Payer: PPO | Attending: Neurology | Admitting: Rehabilitative and Restorative Service Providers"

## 2015-01-15 DIAGNOSIS — R269 Unspecified abnormalities of gait and mobility: Secondary | ICD-10-CM | POA: Diagnosis not present

## 2015-01-15 DIAGNOSIS — R42 Dizziness and giddiness: Secondary | ICD-10-CM | POA: Diagnosis present

## 2015-01-15 DIAGNOSIS — R2681 Unsteadiness on feet: Secondary | ICD-10-CM | POA: Insufficient documentation

## 2015-01-15 NOTE — Therapy (Signed)
[facility] [address] [place], [place], [address] Phone: [phone]   Fax:  [phone]  Physical Therapy Treatment  Patient Details  Name: Laura Pollard MRN: [id number] Date of Birth: [birth year] Referring Provider:  Antony Blackbird, MD  Encounter Date: 01/15/2015      PT End of Session - 01/15/15 1442    Visit Number 8   Number of Visits 12   Date for PT Re-Evaluation 02/15/15   Authorization Type Medicare - G Codes every 10th visit   PT Start Time 0855   PT Stop Time 0935   PT Time Calculation (min) 40 min   Activity Tolerance Patient tolerated treatment well  with rest breaks for dizziness   Behavior During Therapy Anxious      Past Medical History  Diagnosis Date  . Atypical chest pain     normal ETT/Echo in 2003.. card cath normal 2010  . Hypercholesteremia   . Anxiety   . Multiple allergies   . Hyperlipidemia   . MVP (mitral valve prolapse)   . Arthritis   . Scoliosis   . Headache   . Liver disease   . Abnormal heart rhythm   . 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 Nocturnal leg cramps 09/14/2014  . Depression     Past Surgical History  Procedure Laterality Date  . Cardiac catheterization  2010    normal  . Ett  2003    also ECHO..normal  . Ovarian cyst surgery    . Tonsillectomy    . Cesarean section    . Cataract surgery    . Colonoscopy    . Retinal detachment surgery Right 10 years ago    There were no vitals filed for this visit.  Visit Diagnosis:  Abnormality of gait      Subjective Assessment - 01/15/15 0854    Subjective The patient reports worse dizziness when coming in to therapy today compared to how she feels at home.  She denies spinning sensation or headache today.  No nausea since last session.  She reports she has unsteadiness or "tripping" at work, but she doesn't notice feeling as dizzy at work.   She reports h/o episode at work in which she was hot, dizzy, and unable to  breathe.  This has not occurred in months.   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 No/denies     The patient describes light dizziness with head turns seated on mat  NEUROMUSCULAR RE-EDUCATION: The patient performed standing quarter turns moving head/eyes first, followed by body x 180 degree turns to R and L x 3 reps The patient performed staring at targets in order to settle symptoms with education to have a wider base of support for improved balance Horizontal head turns spotting targets in standing  Sit>supine and rolling L and R without nystagmus noted or dizziness described Sit<>stand with cues on core stability for posture re-ed x 10 reps Standing stepping R and L with return to midline for weight shift and core control with CGA STanding trunk rotation reaching to place ball to the right and left with visual tracking of target  Gait: Ambulation with emphasis on wider base of support, changes in speed with CGA, start/stops and direction changes to patient tolerance  Gait with ball toss for dynamic activities with focus on marching, direction changes with CGA to min A for safety Backwards walking with CGA  for safety 15 feet x 3 reps  Performed education regarding slower speed of movement, wider base of support for static standing balance, decreasing use of UEs on wall for support        PT Short Term Goals - 12/31/14 1652    PT SHORT TERM GOAL #1   Title Pt will demonstrate HEP with mod I using paper handout to maximize functional gains made in physical therapy. Modified Target date: 01/14/15.   Baseline Achieved 8/22.   Status Achieved   PT SHORT TERM GOAL #2   Title Pt will verbalize understanding of fall prevention strategies to decrease risk of falling within home. Modified Target date: 01/14/15.   Baseline 8/8: re-addressed fall prevention  strategies due to pt recently returning to PT after 68-monthhiatus.   Status Not Met   PT SHORT TERM GOAL #3   Title Pt will score 16/24 on Dynamic Gait Index to demonstrate improved gait stability in the presence of external demands. Target date: 11/12/14.   Baseline 8/8: DGI score = 17/24   Status Achieved           PT Long Term Goals - 12/17/14 1513    PT LONG TERM GOAL #1   Title Pt will score 20/24 on Dynamic Gait Index to demonstrate decreased risk of falling. Modified Target date: 02/11/15.   Baseline 12/17/14: DGI score =17/24    Status Not Met  Continue goal through renewed POC   PT LONG TERM GOAL #2   Title Pt will ambulate >500' over level/unlevel surfaces with mod I using LRAD to demonstrate safety with community mobility. Modified Target date: 02/11/15.   Baseline 12/17/15: Pt required supervision to ambulate over level surfaces, min guard - min A over unlevel surfaces.   Status Not Met  Continue goal through renewed POC   PT LONG TERM GOAL #3   Title Pt will demonstrate use of 2 compensatory strategies for dizziness during functional mobility without cueing to increase pt safety with functional mobility. Target date: 12/10/14.   Status Not Met  Continue goal through renewed POC               Plan - 01/15/15 1424    Clinical Impression Statement The patient has difficulty describing subjective symptoms.  In therapy, she moves en block for turns during gait activities and tolerates minimal head turns in standing with quarter turns to habituate activity.  PT emphasized the patient performng tasks in a slower, more deliberate nature and maintaining a wider base of support to allow greater stability.     PT Next Visit Plan If husband present at next session, provide education on VBI, HEP, and compensatory strategies to promote carryover.   PT Home Exercise Plan Balance on foam with EO and EC - with targets with EO and head turns with EC; SLS; gaze stabilization in  standing-plain background - gradually incr. time as tolerated   Consulted and Agree with Plan of Care Patient;Family member/caregiver   Family Member Consulted spouse        Problem List Patient Active Problem List   Diagnosis Date Noted  . 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 Dizziness and giddiness 09/14/2014  . Nocturnal leg cramps 09/14/2014    Leeon Makar, PT 01/15/2015, 2:45 PM  [id number] [address]Phone: [phone]  Fax:  [phone]

## 2015-01-21 ENCOUNTER — Ambulatory Visit: Payer: PPO | Admitting: Physical Therapy

## 2015-01-21 DIAGNOSIS — R269 Unspecified abnormalities of gait and mobility: Secondary | ICD-10-CM

## 2015-01-21 DIAGNOSIS — R42 Dizziness and giddiness: Secondary | ICD-10-CM

## 2015-01-21 DIAGNOSIS — R2681 Unsteadiness on feet: Secondary | ICD-10-CM

## 2015-01-21 NOTE — Therapy (Signed)
[facility] [address] [place], [place], [address] Phone: [phone]   Fax:  [phone]  Physical Therapy Treatment  Patient Details  Name: Laura Pollard MRN: [id number] Date of Birth: 05-14-1940 Referring Provider:  Antony Blackbird, MD  Encounter Date: 01/21/2015      PT End of Session - 01/21/15 1233    Visit Number 9   Number of Visits 12   Date for PT Re-Evaluation 02/15/15   Authorization Type Medicare - G Codes every 10th visit   PT Start Time 1151  Pt arrived late for session   PT Stop Time 1231   PT Time Calculation (min) 40 min   Activity Tolerance Patient tolerated treatment well   Behavior During Therapy Anxious      Past Medical History  Diagnosis Date  . Atypical chest pain     normal ETT/Echo in 2003.. card cath normal 2010  . Hypercholesteremia   . Anxiety   . Multiple allergies   . Hyperlipidemia   . MVP (mitral valve prolapse)   . Arthritis   . Scoliosis   . Headache   . Liver disease   . Abnormal heart rhythm   . 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 Nocturnal leg cramps 09/14/2014  . Depression     Past Surgical History  Procedure Laterality Date  . Cardiac catheterization  2010    normal  . Ett  2003    also ECHO..normal  . Ovarian cyst surgery    . Tonsillectomy    . Cesarean section    . Cataract surgery    . Colonoscopy    . Retinal detachment surgery Right 10 years ago    There were no vitals filed for this visit.  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 No/denies                          [facility] Adult PT Treatment/Exercise - 01/21/15 0001    Ambulation/Gait   Ambulation/Gait Yes   Ambulation/Gait Assistance 5: Supervision;4: Min guard;4: Min assist   Ambulation/Gait Assistance Details 550' outdoors; remaining distance indoors   Ambulation Distance (Feet) 1300 Feet   Assistive device None   Gait Pattern Step-through pattern;Wide base of support;Lateral trunk lean to right  RLE scissoring x1 episode   Ambulation Surface Level;Indoor   Stairs Yes   Stairs Assistance 6: Modified independent (Device/Increase time)   Stair Management Technique Alternating pattern;No rails;Forwards   Gait Comments Required cueing to utilize visual aid for strategies to utilize with increased dizziness, feeling of being off-balance.   Standardized Balance Assessment   Standardized Balance Assessment Dynamic Gait Index   Dynamic Gait Index   Level Surface Mild Impairment   Change in Gait Speed Normal   Gait with Horizontal Head Turns Mild Impairment   Gait with Vertical Head Turns Mild Impairment   Gait and Pivot Turn Mild Impairment   Step Over Obstacle Mild Impairment   Step Around Obstacles Mild Impairment   Steps Normal   Total Score 18   Self-Care   Self-Care Other Self-Care Comments  Other Self-Care Comments  Reiterated education on strategies to maximize pt safety when off-balance, dizzy during mobility. Provided paper handout to promote carryover. During gait training, reminded pt to utilize paper handout with onset of gait instability.   Neuro Re-ed    Neuro Re-ed Details  Performed Dynamic Gait Index with score of 18/24. See DGI for detailed findings.   Exercises   Exercises --   Other Exercises  --                PT Education - 01/21/15 1210    Education provided Yes   Education Details Goals, progress, and discharge plan. Tips for staying safe when dizzy/off-balance during mobility.   Person(s) Educated Patient   Methods Explanation;Demonstration;Handout;Verbal  cues   Comprehension Verbalized understanding;Returned demonstration;Verbal cues required          PT Short Term Goals - 12/31/14 1652    PT SHORT TERM GOAL #1   Title Pt will demonstrate HEP with mod I using paper handout to maximize functional gains made in physical therapy. Modified Target date: 01/14/15.   Baseline Achieved 8/22.   Status Achieved   PT SHORT TERM GOAL #2   Title Pt will verbalize understanding of fall prevention strategies to decrease risk of falling within home. Modified Target date: 01/14/15.   Baseline 8/8: re-addressed fall prevention strategies due to pt recently returning to PT after 53-monthhiatus.   Status Not Met   PT SHORT TERM GOAL #3   Title Pt will score 16/24 on Dynamic Gait Index to demonstrate improved gait stability in the presence of external demands. Target date: 11/12/14.   Baseline 8/8: DGI score = 17/24   Status Achieved           PT Long Term Goals - 01/21/15 1203    PT LONG TERM GOAL #1   Title Pt will score 20/24 on Dynamic Gait Index to demonstrate decreased risk of falling. Modified Target date: 02/11/15.   Baseline 9/12: DGI score = 18/20   Status Not Met   PT LONG TERM GOAL #2   Title Pt will ambulate >500' over level/unlevel surfaces with mod I using LRAD to demonstrate safety with community mobility. Modified Target date: 02/11/15.   Baseline 9/12: Pt required supervision/cueing to utilize compensatory strategies to maintain safety with onset of dizziness during mobility.   Status Not Met  Continue goal through renewed POC   PT LONG TERM GOAL #3   Title Pt will demonstrate use of 2 compensatory strategies for dizziness during functional mobility without cueing to increase pt safety with functional mobility. Target date: 12/10/14.   Baseline 9/12: Pt required verbal cueing to utilize visual aid for compensatory strategies with onset of dizziness during ambulation.   Status Partially Met  Continue goal through renewed POC                Plan - 01/21/15 1243    Clinical Impression Statement Pt will be discharged from outpatient PT today due to lack of measurable progress, as patient has partially met 1 of 3 LTG's and has not met 2 of 3 LTG's. Progress has been limited by pt anxiety, decreased pt carryover of education provided during PT sessions. Pt verbalized understanding and was in full agreement with discharge plan.   Pt will benefit from skilled therapeutic intervention in order to improve on the following deficits Abnormal gait;Decreased balance;Decreased cognition;Decreased mobility;Decreased knowledge of use of DME;Decreased coordination;Decreased safety awareness;Postural dysfunction;Dizziness;Decreased activity tolerance;Impaired perceived functional ability  Clinical Impairments Affecting Rehab Potential Cognitive impairments   PT Frequency 1x / week   PT Duration 8 weeks   PT Treatment/Interventions ADLs/Self Care Home Management;Vestibular;Functional mobility training;Stair training;Gait training;DME Instruction;Therapeutic activities;Therapeutic exercise;Balance training;Neuromuscular re-education;Cognitive remediation;Patient/family education;Manual techniques   PT Next Visit Plan N/A; pt discharged from outpatient PT at this time.   Consulted and Agree with Plan of Care Patient          G-Codes - 02-16-15 1236    Functional Assessment Tool Used Dynamic Gait Index 18/24   Functional Limitation Mobility: Walking and moving around   Mobility: Walking and Moving Around Goal Status [phone]) At least 1 percent but less than 20 percent impaired, limited or restricted   Mobility: Walking and Moving Around Discharge Status [phone]) At least 20 percent but less than 40 percent impaired, limited or restricted      PHYSICAL THERAPY DISCHARGE SUMMARY  Visits from Start of Care: 9  Current functional level related to goals / functional outcomes: See goals, status of each goal above.   Remaining  deficits: Pt continues to turn en block during functional mobility and exhibits minimal tolerance to functional head turns. Pt reports ongoing dizziness; however, pt does have difficulty describing dizziness.   Education / Equipment: Educated pt on HEP, fall prevention strategies, strategies for increasing stability/safety when pt off-balance or dizzy during ambulation.   Plan: Patient agrees to discharge.  Patient goals were not met. Patient is being discharged due to lack of progress.  ?????       Problem List Patient Active Problem List   Diagnosis Date Noted  . 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 Dizziness and giddiness 09/14/2014  . Nocturnal leg cramps 09/14/2014    Billie Ruddy, PT, DPT [facility] [address] [place], [place], [address] Phone: [phone]   Fax:  [phone] Feb 16, 2015, 12:55 PM

## 2015-01-21 NOTE — Patient Instructions (Signed)
When you feel off-balance, remember:  - Stand tall and be confident. Don't reach for the wall. - Stop to rest.  - Widen your stance to improve balance. - Stare at targets (spot on a wall) to settle your feeling of being off-balance.

## 2015-01-28 ENCOUNTER — Encounter: Payer: PPO | Admitting: Physical Therapy

## 2015-02-05 ENCOUNTER — Encounter: Payer: Self-pay | Admitting: Neurology

## 2015-02-05 ENCOUNTER — Ambulatory Visit (INDEPENDENT_AMBULATORY_CARE_PROVIDER_SITE_OTHER): Payer: PPO | Admitting: Neurology

## 2015-02-05 VITALS — BP 153/71 | HR 66 | Ht 67.0 in | Wt 152.0 lb

## 2015-02-05 DIAGNOSIS — R269 Unspecified abnormalities of gait and mobility: Secondary | ICD-10-CM

## 2015-02-05 DIAGNOSIS — R42 Dizziness and giddiness: Secondary | ICD-10-CM

## 2015-02-05 DIAGNOSIS — R413 Other amnesia: Secondary | ICD-10-CM | POA: Diagnosis not present

## 2015-02-05 HISTORY — DX: Other amnesia: R41.3

## 2015-02-05 NOTE — Patient Instructions (Addendum)
Consider switching off of the lorazepam, use only low dose alprazolam at night.  Fall Prevention and Home Safety Falls cause injuries and can affect all age groups. It is possible to use preventive measures to significantly decrease the likelihood of falls. There are many simple measures which can make your home safer and prevent falls. OUTDOORS  Repair cracks and edges of walkways and driveways.  Remove high doorway thresholds.  Trim shrubbery on the main path into your home.  Have good outside lighting.  Clear walkways of tools, rocks, debris, and clutter.  Check that handrails are not broken and are securely fastened. Both sides of steps should have handrails.  Have leaves, snow, and ice cleared regularly.  Use sand or salt on walkways during winter months.  In the garage, clean up grease or oil spills. BATHROOM  Install night lights.  Install grab bars by the toilet and in the tub and shower.  Use non-skid mats or decals in the tub or shower.  Place a plastic non-slip stool in the shower to sit on, if needed.  Keep floors dry and clean up all water on the floor immediately.  Remove soap buildup in the tub or shower on a regular basis.  Secure bath mats with non-slip, double-sided rug tape.  Remove throw rugs and tripping hazards from the floors. BEDROOMS  Install night lights.  Make sure a bedside light is easy to reach.  Do not use oversized bedding.  Keep a telephone by your bedside.  Have a firm chair with side arms to use for getting dressed.  Remove throw rugs and tripping hazards from the floor. KITCHEN  Keep handles on pots and pans turned toward the center of the stove. Use back burners when possible.  Clean up spills quickly and allow time for drying.  Avoid walking on wet floors.  Avoid hot utensils and knives.  Position shelves so they are not too high or low.  Place commonly used objects within easy reach.  If necessary, use a  sturdy step stool with a grab bar when reaching.  Keep electrical cables out of the way.  Do not use floor polish or wax that makes floors slippery. If you must use wax, use non-skid floor wax.  Remove throw rugs and tripping hazards from the floor. STAIRWAYS  Never leave objects on stairs.  Place handrails on both sides of stairways and use them. Fix any loose handrails. Make sure handrails on both sides of the stairways are as long as the stairs.  Check carpeting to make sure it is firmly attached along stairs. Make repairs to worn or loose carpet promptly.  Avoid placing throw rugs at the top or bottom of stairways, or properly secure the rug with carpet tape to prevent slippage. Get rid of throw rugs, if possible.  Have an electrician put in a light switch at the top and bottom of the stairs. OTHER FALL PREVENTION TIPS  Wear low-heel or rubber-soled shoes that are supportive and fit well. Wear closed toe shoes.  When using a stepladder, make sure it is fully opened and both spreaders are firmly locked. Do not climb a closed stepladder.  Add color or contrast paint or tape to grab bars and handrails in your home. Place contrasting color strips on first and last steps.  Learn and use mobility aids as needed. Install an electrical emergency response system.  Turn on lights to avoid dark areas. Replace light bulbs that burn out immediately. Get light  switches that glow.  Arrange furniture to create clear pathways. Keep furniture in the same place.  Firmly attach carpet with non-skid or double-sided tape.  Eliminate uneven floor surfaces.  Select a carpet pattern that does not visually hide the edge of steps.  Be aware of all pets. OTHER HOME SAFETY TIPS  Set the water temperature for 120 F (48.8 C).  Keep emergency numbers on or near the telephone.  Keep smoke detectors on every level of the home and near sleeping areas. Document Released: 04/17/2002 Document Revised:  10/27/2011 Document Reviewed: 07/17/2011 [facility] Patient Information 2015 [place], [place]. This information is not intended to replace advice given to you by your health care provider. Make sure you discuss any questions you have with your health care provider.

## 2015-02-05 NOTE — Progress Notes (Signed)
Reason for visit: Gait disorder  Laura Pollard is an 74 y.o. female  History of present illness:  Laura Pollard is a 74 year old right-handed white female with a history of a chronic gait disorder. The gait disorder is quite mild, the patient will have episodes of feeling somewhat dizzy, even nauseated at times. The dizziness generally will occur only with standing. She will be somewhat staggery during these events. She is not having falls. She continues to work. She denies any numbness or weakness of the extremities, she denies any changes in bowel or bladder control. She has undergone physical therapy for balance, she does not believe that this has helped much unfortunately. The patient is on alprazolam and lorazepam in low dose, she takes these medications at night. She may have an occasional day where she feels relatively normal with her walking. MRI of the brain has been done, this shows evidence of tortuosity of the vertebrobasilar system, there appears to be some compression on the medulla and lower pons area. The patient has some small vessel ischemic changes as well. Blood work done was unremarkable. The MRI of the brain was reviewed with the patient. The patient also reports ongoing issues with a memory disturbance that seems to be getting worse over time. The patient has short-term memory issues, she can remember events that occurred a long time ago.  Past Medical History  Diagnosis Date  . Atypical chest pain     normal ETT/Echo in 2003.. card cath normal 2010  . Hypercholesteremia   . Anxiety   . Multiple allergies   . Hyperlipidemia   . MVP (mitral valve prolapse)   . Arthritis   . Scoliosis   . Headache   . Liver disease   . Abnormal heart rhythm   . 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 Nocturnal leg cramps 09/14/2014  . Depression   . Memory difficulties 02/05/2015    Past Surgical History  Procedure Laterality Date  . Cardiac catheterization  2010    normal  . Ett  2003   also ECHO..normal  . Ovarian cyst surgery    . Tonsillectomy    . Cesarean section    . Cataract surgery    . Colonoscopy    . Retinal detachment surgery Right 10 years ago    Family History  Problem Relation Age of Onset  . Heart disease Mother   . CAD Mother   . AAA (abdominal aortic aneurysm) Father   . Bladder Cancer Father   . Kidney cancer Sister     metastasis to lung  . HIV Son     Social history:  reports that she quit smoking about 48 years ago. She has never used smokeless tobacco. She reports that she drinks alcohol. She reports that she does not use illicit drugs.    Allergies  Allergen Reactions  . Indocin [Indomethacin] Other (See Comments)    Nausea and vomiting   . Keflex [Cephalexin] Other (See Comments)    unknown reaction   . Lipitor [Atorvastatin] Other (See Comments)    Muscle aches   . Pravastatin Other (See Comments)    Muscle aches   . Tape Rash    PAPER TAPE --- RASH    Medications:  Prior to Admission medications   Medication Sig Start Date End Date Taking? Authorizing Sharalee Witman  ALPRAZolam [name]) 0.25 MG tablet Take 0.25 mg by mouth at bedtime as needed for anxiety.   Yes Historical Lenette Rau, MD  Calcium-Vitamin D-Vitamin K 568-127-51  MG-UNT-MCG CHEW Chew 1 tablet by mouth daily.   Yes Historical Sherly Brodbeck, MD  cholecalciferol (VITAMIN D) 1000 UNITS tablet Take 2,000 Units by mouth daily.   Yes Historical Alichia Alridge, MD  Coenzyme Q10 (CO Q 10 PO) Take 1 capsule by mouth daily.   Yes Historical Nickayla Mcinnis, MD  Cyanocobalamin 2500 MCG TABS Take 1 tablet by mouth daily.   Yes Historical Caliann Leckrone, MD  ezetimibe (ZETIA) 10 MG tablet Take 10 mg by mouth daily.   Yes Historical Collier Monica, MD  FLUoxetine (PROZAC) 20 MG capsule Take 20 mg by mouth daily.   Yes Historical Aolanis Crispen, MD  Glucosamine HCl 1500 MG TABS Take 1 tablet by mouth 2 (two) times daily.   Yes Historical Trine Fread, MD  GuaiFENesin (MUCINEX PO) Take by mouth as directed. No specified  directions in paperwork.   Yes Historical Finnley Larusso, MD  Ibuprofen (ADVIL PO) Take by mouth as directed. No specified directions in paperwork   Yes Historical Mychal Decarlo, MD  LORazepam (ATIVAN) 0.5 MG tablet Take 0.5 mg by mouth at bedtime as needed for anxiety.   Yes Historical Glendia Olshefski, MD  Omega 3 1200 MG CAPS Take 1 capsule by mouth daily.   Yes Historical Bhumi Godbey, MD  Turmeric Curcumin 500 MG CAPS Take 1 capsule by mouth daily.   Yes Historical Marnette Perkins, MD    ROS:  Out of a complete 14 system review of symptoms, the patient complains only of the following symptoms, and all other reviewed systems are negative.  Gait disorder Dizziness Memory problems  Blood pressure 153/71, pulse 66, height 5\' 7"  (1.702 m), weight 152 lb (68.947 kg).   Blood pressure, left arm, standing is 128/82. Blood pressure, left arm, sitting is 148/72.  Physical Exam  General: The patient is alert and cooperative at the time of the examination.  Skin: No significant peripheral edema is noted.   Neurologic Exam  Mental status: The patient is alert and oriented x 2 at the time of the examination (not oriented to date). Mini-Mental Status Examination done today shows a total score of 28/30. The patient is able to name 13 animals in 30 seconds.   Cranial nerves: Facial symmetry is present. Speech is normal, no aphasia or dysarthria is noted. Extraocular movements are full. Visual fields are full.  Motor: The patient has good strength in all 4 extremities.  Sensory examination: Soft touch sensation is symmetric on the face, arms, and legs.  Coordination: The patient has good finger-nose-finger and heel-to-shin bilaterally.  Gait and station: The patient has a normal gait. Tandem gait is slightly unsteady. Romberg is negative. No drift is seen.  Reflexes: Deep tendon reflexes are symmetric.   MRI brain 09/24/14:  IMPRESSION: This MRI of the brain without contrast shows the following: 1. 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 The right vertebral artery is very tortuous and has a diameter of 6.7 mm. The artery slightly displaces the medulla towards the left. 2. Mild cortical atrophy that is more evident in the mesial temporal lobes 3. Scattered T2/flair hyperintense foci consistent with small vessel ischemic changes. The extent is mildly more than expected for age.  * MRI scan images were reviewed online. I agree with the written report.   Assessment/Plan:  1. Dizziness, gait instability  2. Memory disturbance  The patient has evidence of tortuosity of the vertebrobasilar system that results in some compression of the brainstem. This could be the etiology of her symptoms, but importantly this is not surgically correctable. The patient  will need to cope with the gait disorder, she may need to use a cane when she is feeling dizzy. The patient is also on 2 different benzodiazepine, I have recommended switching only to a single agent, using the short acting alprazolam to prevent carryover of drug effect into the waking hours. The patient will follow-up in 6 months, the memory issues will need to be followed over time.  Jill Alexanders MD 02/05/2015 8:34 PM  [facility] [address] [place],  [address]  Phone [phone] Fax [phone]

## 2015-05-21 DIAGNOSIS — F3341 Major depressive disorder, recurrent, in partial remission: Secondary | ICD-10-CM | POA: Diagnosis not present

## 2015-08-05 ENCOUNTER — Ambulatory Visit (INDEPENDENT_AMBULATORY_CARE_PROVIDER_SITE_OTHER): Payer: PPO | Admitting: Neurology

## 2015-08-05 ENCOUNTER — Encounter: Payer: Self-pay | Admitting: Neurology

## 2015-08-05 VITALS — BP 126/82 | Ht 67.0 in | Wt 149.5 lb

## 2015-08-05 DIAGNOSIS — G4762 Sleep related leg cramps: Secondary | ICD-10-CM

## 2015-08-05 DIAGNOSIS — R413 Other amnesia: Secondary | ICD-10-CM | POA: Diagnosis not present

## 2015-08-05 DIAGNOSIS — R269 Unspecified abnormalities of gait and mobility: Secondary | ICD-10-CM | POA: Diagnosis not present

## 2015-08-05 DIAGNOSIS — R42 Dizziness and giddiness: Secondary | ICD-10-CM | POA: Diagnosis not present

## 2015-08-05 MED ORDER — DONEPEZIL HCL 5 MG PO TABS: 5.0000 mg | ORAL_TABLET | Freq: Every day | ORAL | Status: DC

## 2015-08-05 NOTE — Progress Notes (Signed)
Reason for visit: Dizziness  Laura Pollard is an 75 y.o. female  History of present illness:  Laura Pollard is a 75 year old right-handed white female with a history of chronic dizziness. 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 This is not a treatable issue. The patient continues to work. She does not use a cane for ambulation, she does report some gait instability at times. Within the last month, she has noted some discomfort in the toes of her feet, this is worse when she is off of her feet, not when she is trying to walk. The patient does have some low back pain, but this does not radiate down the legs. The patient continues to have some difficulty with short-term memory. She returns to the office today for an evaluation. She denies any other new medical issues.  Past Medical History  Diagnosis Date  . Atypical chest pain     normal ETT/Echo in 2003.. card cath normal 2010  . Hypercholesteremia   . Anxiety   . Multiple allergies   . Hyperlipidemia   . MVP (mitral valve prolapse)   . Arthritis   . Scoliosis   . Headache   . Liver disease   . Abnormal heart rhythm   . 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 Nocturnal leg cramps 09/14/2014  . Depression   . Memory difficulties 02/05/2015    Past Surgical History  Procedure Laterality Date  . Cardiac catheterization  2010    normal  . Ett  2003    also ECHO..normal  . Ovarian cyst surgery    . Tonsillectomy    . Cesarean section    . Cataract surgery    . Colonoscopy    . Retinal detachment surgery Right 10 years ago    Family History  Problem Relation Age of Onset  . Heart disease Mother   . CAD Mother   . AAA (abdominal aortic aneurysm) Father   . Bladder Cancer Father   . Kidney cancer Sister     metastasis to lung  . HIV Son     Social history:  reports that she quit smoking about 49 years ago. She has never used smokeless tobacco. She  reports that she drinks alcohol. She reports that she does not use illicit drugs.    Allergies  Allergen Reactions  . Indocin [Indomethacin] Other (See Comments)    Nausea and vomiting   . Keflex [Cephalexin] Other (See Comments)    unknown reaction   . Lipitor [Atorvastatin] Other (See Comments)    Muscle aches   . Pravastatin Other (See Comments)    Muscle aches   . Tape Rash    PAPER TAPE --- RASH    Medications:  Prior to Admission medications   Medication Sig Start Date End Date Taking? Authorizing Provider  ALPRAZolam [name]) 0.25 MG tablet Take 0.25 mg by mouth at bedtime as needed for anxiety.   Yes Historical Provider, MD  Calcium-Vitamin D-Vitamin K 500-100-40 MG-UNT-MCG CHEW Chew 1 tablet by mouth daily.   Yes Historical Provider, MD  cholecalciferol (VITAMIN D) 1000 UNITS tablet Take 2,000 Units by mouth daily.   Yes Historical Provider, MD  Coenzyme Q10 (CO Q 10 PO) Take 1 capsule by mouth daily.   Yes Historical Provider, MD  Cyanocobalamin 2500 MCG TABS Take 1 tablet by mouth daily.   Yes Historical Provider, MD  ezetimibe (ZETIA) 10 MG tablet Take  10 mg by mouth daily.   Yes Historical Provider, MD  FLUoxetine (PROZAC) 20 MG capsule Take 20 mg by mouth daily.   Yes Historical Provider, MD  Glucosamine HCl 1500 MG TABS Take 1 tablet by mouth 2 (two) times daily.   Yes Historical Provider, MD  GuaiFENesin (MUCINEX PO) Take by mouth as directed. No specified directions in paperwork.   Yes Historical Provider, MD  Ibuprofen (ADVIL PO) Take by mouth as directed. No specified directions in paperwork   Yes Historical Provider, MD  LORazepam (ATIVAN) 0.5 MG tablet Take 0.5 mg by mouth at bedtime as needed for anxiety.   Yes Historical Provider, MD  Omega 3 1200 MG CAPS Take 1 capsule by mouth daily.   Yes Historical Provider, MD  Turmeric Curcumin 500 MG CAPS Take 1 capsule by mouth daily.   Yes Historical Provider, MD    ROS:  Out of a complete 14 system review of  symptoms, the patient complains only of the following symptoms, and all other reviewed systems are negative.  Memory disturbance Foot pain Dizziness  Blood pressure 126/82, height 5\' 7"  (1.702 m), weight 149 lb 8 oz (67.813 kg).  Physical Exam  General: The patient is alert and cooperative at the time of the examination.  Skin: No significant peripheral edema is noted.   Neurologic Exam  Mental status: The patient is alert and oriented x 3 at the time of the examination. The patient has apparent normal recent and remote memory, with an apparently normal attention span and concentration ability.   Cranial nerves: Facial symmetry is present. Speech is normal, no aphasia or dysarthria is noted. Extraocular movements are full. Visual fields are full.  Motor: The patient has good strength in all 4 extremities.  Sensory examination: Soft touch sensation is symmetric on the face, arms, and legs.  Coordination: The patient has good finger-nose-finger and heel-to-shin bilaterally. There is a stocking pattern pinprick sensory deficit across the ankles bilaterally.  Gait and station: The patient has a normal gait. Tandem gait is unsteady. Romberg is negative. No drift is seen.  Reflexes: Deep tendon reflexes are symmetric.   Assessment/Plan:  1. Memory disturbance  2. Dizziness  3. Bilateral foot pain  4. Mild gait disturbance  The patient is having some new issues with discomfort in the feet that may represent an early peripheral neuropathy. She does not wish to pursue workup with EMG and nerve conduction study. The patient is having some ongoing memory issues, we discussed going on Aricept, I have called in a small prescription for the 5 mg tablets. The patient will follow-up in 6 months. She is to contact our office in one month if she is tolerating the Aricept, will go to the 10 mg dosing. Currently, the foot pain does not keep her awake at night.  Jill Alexanders MD 08/05/2015  2:20 PM  [facility] [address] [place], [place] [address]  Phone [phone] Fax [phone]

## 2015-08-05 NOTE — Patient Instructions (Signed)
   Begin Aricept (donepezil) at 5 mg at night for one month. If this medication is well-tolerated, please call our office and we will call in a prescription for the 10 mg tablets. Look out for side effects that may include nausea, diarrhea, weight loss, or stomach cramps. This medication will also cause a runny nose, therefore there is no need for allergy medications for this purpose.  

## 2015-08-12 ENCOUNTER — Telehealth: Payer: Self-pay | Admitting: Neurology

## 2015-08-12 MED ORDER — RIVASTIGMINE 4.6 MG/24HR TD PT24: 4.6000 mg | MEDICATED_PATCH | Freq: Every day | TRANSDERMAL | Status: DC

## 2015-08-12 NOTE — Telephone Encounter (Signed)
Pt said she has been taking donepezil (ARICEPT) 5 MG tablet for 2 weeks. She has loose bowels yesterday and today. She was unable to attend work today. Pt sts she has an exisitng problem with her colon and is not sure if this is related to that or if it is the medication.

## 2015-08-12 NOTE — Telephone Encounter (Signed)
I called the patient. She is having severe diarrhea on Aricept, she will stop the drug now, and once she returns to normal, we will try the Exelon patch, if she cannot tolerate this we will go to [place].

## 2015-08-21 DIAGNOSIS — F3341 Major depressive disorder, recurrent, in partial remission: Secondary | ICD-10-CM | POA: Diagnosis not present

## 2015-09-10 DIAGNOSIS — L280 Lichen simplex chronicus: Secondary | ICD-10-CM | POA: Diagnosis not present

## 2015-09-10 DIAGNOSIS — L821 Other seborrheic keratosis: Secondary | ICD-10-CM | POA: Diagnosis not present

## 2015-09-10 DIAGNOSIS — D485 Neoplasm of uncertain behavior of skin: Secondary | ICD-10-CM | POA: Diagnosis not present

## 2015-09-28 ENCOUNTER — Other Ambulatory Visit: Payer: Self-pay | Admitting: Neurology

## 2015-09-30 ENCOUNTER — Other Ambulatory Visit: Payer: Self-pay

## 2015-09-30 NOTE — Telephone Encounter (Signed)
Pharmacy is requesting refill on Aricept. Per previous notes, pt did not tolerate d/t GI side effect and Dr. Tawanna Cooler mentioned trying Exelon patch next.

## 2015-10-05 ENCOUNTER — Other Ambulatory Visit: Payer: Self-pay | Admitting: Neurology

## 2015-10-23 DIAGNOSIS — H40013 Open angle with borderline findings, low risk, bilateral: Secondary | ICD-10-CM | POA: Diagnosis not present

## 2015-10-23 DIAGNOSIS — J069 Acute upper respiratory infection, unspecified: Secondary | ICD-10-CM | POA: Diagnosis not present

## 2015-12-03 ENCOUNTER — Encounter: Payer: Self-pay | Admitting: *Deleted

## 2015-12-03 ENCOUNTER — Other Ambulatory Visit: Payer: Self-pay | Admitting: *Deleted

## 2015-12-03 MED ORDER — RIVASTIGMINE 4.6 MG/24HR TD PT24: MEDICATED_PATCH | TRANSDERMAL | 1 refills | Status: DC

## 2015-12-19 DIAGNOSIS — F3341 Major depressive disorder, recurrent, in partial remission: Secondary | ICD-10-CM | POA: Diagnosis not present

## 2016-01-13 ENCOUNTER — Ambulatory Visit (HOSPITAL_COMMUNITY): Payer: Worker's Compensation

## 2016-01-13 ENCOUNTER — Encounter (HOSPITAL_COMMUNITY): Payer: Self-pay | Admitting: *Deleted

## 2016-01-13 ENCOUNTER — Emergency Department (HOSPITAL_COMMUNITY)
Admission: EM | Admit: 2016-01-13 | Discharge: 2016-01-13 | Disposition: A | Discharge status: Home / Self Care | Payer: Worker's Compensation | Attending: Emergency Medicine | Admitting: Emergency Medicine

## 2016-01-13 DIAGNOSIS — Z87891 Personal history of nicotine dependence: Secondary | ICD-10-CM | POA: Insufficient documentation

## 2016-01-13 DIAGNOSIS — T148XXA Other injury of unspecified body region, initial encounter: Secondary | ICD-10-CM

## 2016-01-13 DIAGNOSIS — S069X1A Unspecified intracranial injury with loss of consciousness of 30 minutes or less, initial encounter: Secondary | ICD-10-CM | POA: Diagnosis not present

## 2016-01-13 DIAGNOSIS — Y990 Civilian activity done for income or pay: Secondary | ICD-10-CM | POA: Diagnosis not present

## 2016-01-13 DIAGNOSIS — Z79899 Other long term (current) drug therapy: Secondary | ICD-10-CM | POA: Insufficient documentation

## 2016-01-13 DIAGNOSIS — W01198A Fall on same level from slipping, tripping and stumbling with subsequent striking against other object, initial encounter: Secondary | ICD-10-CM | POA: Diagnosis not present

## 2016-01-13 DIAGNOSIS — S0181XA Laceration without foreign body of other part of head, initial encounter: Secondary | ICD-10-CM | POA: Diagnosis not present

## 2016-01-13 DIAGNOSIS — Y929 Unspecified place or not applicable: Secondary | ICD-10-CM | POA: Insufficient documentation

## 2016-01-13 DIAGNOSIS — Y939 Activity, unspecified: Secondary | ICD-10-CM | POA: Insufficient documentation

## 2016-01-13 DIAGNOSIS — S0990XA Unspecified injury of head, initial encounter: Secondary | ICD-10-CM

## 2016-01-13 LAB — I-STAT CHEM 8, ED
BUN: 17 mg/dL (ref 6–20)
CREATININE: 0.5 mg/dL (ref 0.44–1.00)
Calcium, Ion: 1.2 mmol/L (ref 1.15–1.40)
Chloride: 104 mmol/L (ref 101–111)
Glucose, Bld: 97 mg/dL (ref 65–99)
HEMATOCRIT: 40 % (ref 36.0–46.0)
HEMOGLOBIN: 13.6 g/dL (ref 12.0–15.0)
POTASSIUM: 4.2 mmol/L (ref 3.5–5.1)
Sodium: 140 mmol/L (ref 135–145)
TCO2: 24 mmol/L (ref 0–100)

## 2016-01-13 LAB — I-STAT TROPONIN, ED: TROPONIN I, POC: 0 ng/mL (ref 0.00–0.08)

## 2016-01-13 MED ORDER — SODIUM CHLORIDE 0.9 % IV SOLN
INTRAVENOUS | Status: DC
  Administered 2016-01-13: 13:00:00 via INTRAVENOUS

## 2016-01-13 MED ORDER — HYDROCODONE-ACETAMINOPHEN 5-325 MG PO TABS: 1.0000 | ORAL_TABLET | ORAL | 0 refills | Status: DC | PRN

## 2016-01-13 MED ORDER — METHOCARBAMOL 500 MG PO TABS: 500.0000 mg | ORAL_TABLET | Freq: Two times a day (BID) | ORAL | 0 refills | Status: DC

## 2016-01-13 MED ORDER — SODIUM CHLORIDE 0.9 % IV BOLUS (SEPSIS)
1000.0000 mL | Freq: Once | INTRAVENOUS | Status: AC
  Administered 2016-01-13: 1000 mL via INTRAVENOUS

## 2016-01-13 MED ORDER — TETANUS-DIPHTHERIA TOXOIDS TD 5-2 LFU IM INJ
0.5000 mL | INJECTION | Freq: Once | INTRAMUSCULAR | Status: AC
  Administered 2016-01-13: 0.5 mL via INTRAMUSCULAR
  Filled 2016-01-13: qty 0.5

## 2016-01-13 NOTE — ED Triage Notes (Signed)
Pt in from work at [employer], via [employer] after tripping over boxes while working. Pt fell onto R side of head, sustained R temporal hematoma and LOC x 66min per witnesses. On EMS arrival, pt was diaphoretic, dizzy and c/o chest tightness. Arrives to ED alert, oriented x 4.

## 2016-01-13 NOTE — ED Provider Notes (Signed)
[facility] DEPT Provider Note   CSN: [id number] Arrival date & time: 01/13/16  1206     History   Chief Complaint No chief complaint on file.   HPI Angles A Delaughter is a 75 y.o. female.  75 year old female here for mechanical fall just prior to arrival. States she tripped over a box at work and fell and struck the right side of her head. Her loss of consciousness for less than a minute. Patient was found to be diaphoretic. She denies any chest pressure but does have some chest tightness which she says she gets when she becomes anxious and she does feel anxious at this time. Denies any associated dyspnea. No cough or congestion. No recent illnesses. Denies any neck pain. No numbness or tingling going down her arms or legs. No abdominal discomfort. No recent history of blood loss. EMS called and patient transported here      Past Medical History:  Diagnosis Date  . Abnormal heart rhythm   . Anxiety   . Arthritis   . Atypical chest pain    normal ETT/Echo in 2003.. card cath normal 2010  . Depression   . 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 Headache   . Hypercholesteremia   . Hyperlipidemia   . Liver disease   . Memory difficulties 02/05/2015  . Multiple allergies   . MVP (mitral valve prolapse)   . Nocturnal leg cramps 09/14/2014  . Scoliosis     Patient Active Problem List   Diagnosis Date Noted  . Memory difficulties 02/05/2015  . 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 Dizziness and giddiness 09/14/2014  . Nocturnal leg cramps 09/14/2014    Past Surgical History:  Procedure Laterality Date  . CARDIAC CATHETERIZATION  2010   normal  . cataract surgery    . CESAREAN SECTION    . COLONOSCOPY    . ETT  2003   also ECHO..normal  . OVARIAN CYST SURGERY    . RETINAL DETACHMENT SURGERY Right 10 years ago  . TONSILLECTOMY      OB History    No data available       Home Medications    Prior to Admission medications   Medication Sig Start Date End Date Taking? Authorizing  Provider  ALPRAZolam [name]) 0.25 MG tablet Take 0.25 mg by mouth at bedtime as needed for anxiety.    Historical Provider, MD  Calcium-Vitamin D-Vitamin K 500-100-40 MG-UNT-MCG CHEW Chew 1 tablet by mouth daily.    Historical Provider, MD  cholecalciferol (VITAMIN D) 1000 UNITS tablet Take 2,000 Units by mouth daily.    Historical Provider, MD  Coenzyme Q10 (CO Q 10 PO) Take 1 capsule by mouth daily.    Historical Provider, MD  Cyanocobalamin 2500 MCG TABS Take 1 tablet by mouth daily.    Historical Provider, MD  ezetimibe (ZETIA) 10 MG tablet Take 10 mg by mouth daily.    Historical Provider, MD  FLUoxetine (PROZAC) 40 MG capsule Take 40 mg by mouth daily.    Historical Provider, MD  Glucosamine HCl 1500 MG TABS Take 1 tablet by mouth 2 (two) times daily.    Historical Provider, MD  GuaiFENesin (MUCINEX PO) Take by mouth as directed. No specified directions in paperwork.    Historical Provider, MD  Ibuprofen (ADVIL PO) Take by mouth as directed. No specified directions in paperwork    Historical Provider, MD  LORazepam (ATIVAN) 0.5 MG tablet Take 0.5 mg by mouth at bedtime as needed for anxiety.    Historical Provider,  MD  Omega 3 1200 MG CAPS Take 1 capsule by mouth daily.    Historical Provider, MD  rivastigmine (EXELON) 4.6 mg/24hr PLACE 1 PATCH ONTO THE SKIN DAILY 12/03/15   Kathrynn Ducking, MD  Turmeric Curcumin 500 MG CAPS Take 1 capsule by mouth daily.    Historical Provider, MD    Family History Family History  Problem Relation Age of Onset  . Heart disease Mother   . CAD Mother   . AAA (abdominal aortic aneurysm) Father   . Bladder Cancer Father   . Kidney cancer Sister     metastasis to lung  . HIV Son     Social History Social History  Substance Use Topics  . Smoking status: Former Smoker    Quit date: 05/11/1966  . Smokeless tobacco: Never Used  . Alcohol use 0.0 oz/week     Comment: rarely     Allergies   Indocin [indomethacin]; Keflex [cephalexin]; Lipitor  [atorvastatin]; Pravastatin; and Tape   Review of Systems Review of Systems  All other systems reviewed and are negative.    Physical Exam Updated Vital Signs There were no vitals taken for this visit.  Physical Exam  Constitutional: She is oriented to person, place, and time. She appears well-developed and well-nourished.  Non-toxic appearance. No distress.  HENT:  Head:    Eyes: Conjunctivae, EOM and lids are normal. Pupils are equal, round, and reactive to light.  Neck: Normal range of motion. Neck supple. No spinous process tenderness and no muscular tenderness present. No tracheal deviation and normal range of motion present. No thyroid mass present.  Cardiovascular: Normal rate, regular rhythm and normal heart sounds.  Exam reveals no gallop.   No murmur heard. Pulmonary/Chest: Effort normal and breath sounds normal. No stridor. No respiratory distress. She has no decreased breath sounds. She has no wheezes. She has no rhonchi. She has no rales.  Abdominal: Soft. Normal appearance and bowel sounds are normal. She exhibits no distension. There is no tenderness. There is no rebound and no CVA tenderness.  Musculoskeletal: Normal range of motion. She exhibits no edema or tenderness.  Neurological: She is alert and oriented to person, place, and time. She has normal strength. No cranial nerve deficit or sensory deficit. GCS eye subscore is 4. GCS verbal subscore is 5. GCS motor subscore is 6.  Skin: Skin is warm and dry. No abrasion and no rash noted.  Psychiatric: She has a normal mood and affect. Her speech is normal and behavior is normal.  Nursing note and vitals reviewed.    ED Treatments / Results  Labs (all labs ordered are listed, but only abnormal results are displayed) Labs Reviewed  I-STAT CHEM 8, ED  I-STAT TROPOININ, ED    EKG  EKG Interpretation None       Radiology No results found.  Procedures Procedures (including critical care  time)  Medications Ordered in ED Medications  sodium chloride 0.9 % bolus 1,000 mL (not administered)  0.9 %  sodium chloride infusion (not administered)     Initial Impression / Assessment and Plan / ED Course  I have reviewed the triage vital signs and the nursing notes.  Pertinent labs & imaging results that were available during my care of the patient were reviewed by me and considered in my medical decision making (see chart for details).  Clinical Course     Final Clinical Impressions(s) / ED Diagnoses   Final diagnoses:  None    New Prescriptions New  Prescriptions   No medications on file   Patient's wound is not suturable at this time. Tetanus status was updated. Patient hydrated here. She is not orthostatic. Stable for discharge   Lacretia Leigh, MD 01/13/16 1438

## 2016-01-17 DIAGNOSIS — M2550 Pain in unspecified joint: Secondary | ICD-10-CM | POA: Diagnosis not present

## 2016-01-17 DIAGNOSIS — M25551 Pain in right hip: Secondary | ICD-10-CM | POA: Diagnosis not present

## 2016-01-17 DIAGNOSIS — M62830 Muscle spasm of back: Secondary | ICD-10-CM | POA: Diagnosis not present

## 2016-01-17 DIAGNOSIS — F0781 Postconcussional syndrome: Secondary | ICD-10-CM | POA: Diagnosis not present

## 2016-01-17 DIAGNOSIS — S0083XD Contusion of other part of head, subsequent encounter: Secondary | ICD-10-CM | POA: Diagnosis not present

## 2016-01-20 DIAGNOSIS — M25551 Pain in right hip: Secondary | ICD-10-CM | POA: Diagnosis not present

## 2016-01-20 DIAGNOSIS — F3341 Major depressive disorder, recurrent, in partial remission: Secondary | ICD-10-CM | POA: Diagnosis not present

## 2016-01-27 ENCOUNTER — Other Ambulatory Visit: Payer: Self-pay | Admitting: Neurology

## 2016-02-05 ENCOUNTER — Ambulatory Visit (INDEPENDENT_AMBULATORY_CARE_PROVIDER_SITE_OTHER): Payer: PPO | Admitting: Adult Health

## 2016-02-05 ENCOUNTER — Encounter: Payer: Self-pay | Admitting: Adult Health

## 2016-02-05 VITALS — BP 133/77 | HR 82 | Wt 154.0 lb

## 2016-02-05 DIAGNOSIS — R413 Other amnesia: Secondary | ICD-10-CM

## 2016-02-05 DIAGNOSIS — R42 Dizziness and giddiness: Secondary | ICD-10-CM | POA: Diagnosis not present

## 2016-02-05 NOTE — Progress Notes (Signed)
PATIENT: Laura Pollard DOB: Mar 10, 1941  REASON FOR VISIT: follow up- dizziness, memory HISTORY FROM: patient  HISTORY OF PRESENT ILLNESS: Laura Pollard is a 75 year old female with a history of dizziness and memory disturbance. She returns today for follow-up. She reports that her dizziness has been under good control. She reports that her memory has remained relatively the same. She lives at home with her husband. Able to complete all ADLs. She is not operating a  Motor vehicle at this time due to a recent fall. She states that she is forgetful of appointments and things she needs to do. She also writes herself notes. She continues to work at [employer]. She states more recently she was working and tripped over a computer cord falling on her right hip. She states that they completed x-rays that did not show a fracture. However she continues to have hip pain. She has an appointment on Friday with her orthopedist. She is on the Exelon patch for her memory and is tolerating it well.   HISTORY 08/05/15: Laura Pollard is a 75 year old right-handed white female with a history of chronic dizziness. 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 This is not a treatable issue. The patient continues to work. She does not use a cane for ambulation, she does report some gait instability at times. Within the last month, she has noted some discomfort in the toes of her feet, this is worse when she is off of her feet, not when she is trying to walk. The patient does have some low back pain, but this does not radiate down the legs. The patient continues to have some difficulty with short-term memory. She returns to the office today for an evaluation. She denies any other new medical issues.   REVIEW OF SYSTEMS: Out of a complete 14 system review of symptoms, the patient complains only of the following symptoms, and all other reviewed  systems are negative.  Insomnia, snoring  ALLERGIES: Allergies  Allergen Reactions  . Indocin [Indomethacin] Other (See Comments)    Nausea and vomiting   . Keflex [Cephalexin] Other (See Comments)    unknown reaction   . Lipitor [Atorvastatin] Other (See Comments)    Muscle aches   . Pravastatin Other (See Comments)    Muscle aches   . Tape Rash    PAPER TAPE --- RASH    HOME MEDICATIONS: Outpatient Medications Prior to Visit  Medication Sig Dispense Refill  . ALPRAZolam (XANAX) 0.25 MG tablet Take 0.25 mg by mouth at bedtime as needed for anxiety.    . Calcium-Vitamin D-Vitamin K 500-100-40 MG-UNT-MCG CHEW Chew 1 tablet by mouth daily.    . cholecalciferol (VITAMIN D) 1000 UNITS tablet Take 2,000 Units by mouth daily.    . Coenzyme Q10 (CO Q 10 PO) Take 1 capsule by mouth daily.    . Cyanocobalamin 2500 MCG TABS Take 1 tablet by mouth daily.    Marland Kitchen ezetimibe (ZETIA) 10 MG tablet Take 10 mg by mouth daily.    Marland Kitchen FLUoxetine (PROZAC) 40 MG capsule Take 40 mg by mouth daily.    . Glucosamine HCl 1500 MG TABS Take 1 tablet by mouth 2 (two) times daily.    . GuaiFENesin (MUCINEX PO) Take by mouth as directed. No specified directions in paperwork.    Marland Kitchen HYDROcodone-acetaminophen (NORCO/VICODIN) 5-325 MG tablet Take 1-2 tablets by mouth every 4 (four) hours as needed.  12 tablet 0  . Ibuprofen (ADVIL PO) Take by mouth as directed. No specified directions in paperwork    . LORazepam (ATIVAN) 0.5 MG tablet Take 0.5 mg by mouth at bedtime as needed for anxiety.    . methocarbamol (ROBAXIN) 500 MG tablet Take 1 tablet (500 mg total) by mouth 2 (two) times daily. 20 tablet 0  . Omega 3 1200 MG CAPS Take 1 capsule by mouth daily.    . rivastigmine (EXELON) 4.6 mg/24hr PLACE 1 PATCH ONTO THE SKIN EVERY DAY 30 patch 0  . Turmeric Curcumin 500 MG CAPS Take 1 capsule by mouth daily.     No facility-administered medications prior to visit.     PAST MEDICAL HISTORY: Past Medical History:    Diagnosis Date  . Abnormal heart rhythm   . Anxiety   . Arthritis   . Atypical chest pain    normal ETT/Echo in 2003.. card cath normal 2010  . Depression   . 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 Headache   . Hypercholesteremia   . Hyperlipidemia   . Liver disease   . Memory difficulties 02/05/2015  . Multiple allergies   . MVP (mitral valve prolapse)   . Nocturnal leg cramps 09/14/2014  . Scoliosis     PAST SURGICAL HISTORY: Past Surgical History:  Procedure Laterality Date  . CARDIAC CATHETERIZATION  2010   normal  . cataract surgery    . CESAREAN SECTION    . COLONOSCOPY    . ETT  2003   also ECHO..normal  . OVARIAN CYST SURGERY    . RETINAL DETACHMENT SURGERY Right 10 years ago  . TONSILLECTOMY      FAMILY HISTORY: Family History  Problem Relation Age of Onset  . Heart disease Mother   . CAD Mother   . AAA (abdominal aortic aneurysm) Father   . Bladder Cancer Father   . Kidney cancer Sister     metastasis to lung  . HIV Son     SOCIAL HISTORY: Social History   Social History  . Marital status: Married    Spouse name: N/A  . Number of children: 2  . Years of education: some coll.   Occupational History  . Not on file.   Social History Main Topics  . Smoking status: Former Smoker    Quit date: 05/11/1966  . Smokeless tobacco: Never Used  . Alcohol use 0.0 oz/week     Comment: rarely  . Drug use: No  . Sexual activity: Not on file   Other Topics Concern  . Not on file   Social History Narrative   Patient is right handed.   Patient drinks 1-2 cups of caffeine daily.      PHYSICAL EXAM  Vitals:   02/05/16 1000  BP: 133/77  Pulse: 82  Weight: 154 lb (69.9 kg)   Body mass index is 24.12 kg/m.   MMSE - Mini Mental State Exam 08/05/2015 02/05/2015  Orientation to time 5 4  Orientation to Place 5 5  Registration 3 3  Attention/ Calculation 5 5  Recall 3 3  Language- name 2 objects 2 2  Language- repeat 1 1  Language- follow 3 step command 3 2   Language- read & follow direction 1 1  Write a sentence 1 1  Copy design 1 1  Total score 30 28     Generalized: Well developed, in no acute distress   Neurological examination  Mentation: Alert oriented to time, place, history taking. Follows all  commands speech and language fluent Cranial nerve II-XII: Pupils were equal round reactive to light. Extraocular movements were full, visual field were full on confrontational test. Facial sensation and strength were normal. Uvula tongue midline. Head turning and shoulder shrug  were normal and symmetric. Motor: The motor testing reveals 5 over 5 strength of all 4 extremities. Good symmetric motor tone is noted throughout.  Sensory: Sensory testing is intact to soft touch on all 4 extremities. No evidence of extinction is noted.  Coordination: Cerebellar testing reveals good finger-nose-finger and heel-to-shin bilaterally.  Gait and station: Gait difficulty due to right hip pain. Tandem gait not attempted. Reflexes: Deep tendon reflexes are symmetric and normal bilaterally.   DIAGNOSTIC DATA (LABS, IMAGING, TESTING) - I reviewed patient records, labs, notes, testing and imaging myself where available.  Lab Results  Component Value Date   HGB 13.6 01/13/2016   HCT 40.0 01/13/2016      Component Value Date/Time   NA 140 01/13/2016 1237   K 4.2 01/13/2016 1237   CL 104 01/13/2016 1237   GLUCOSE 97 01/13/2016 1237   BUN 17 01/13/2016 1237   CREATININE 0.50 01/13/2016 1237      ASSESSMENT AND PLAN 75 y.o. year old female  has a past medical history of Abnormal heart rhythm; Anxiety; Arthritis; Atypical chest pain; Depression; Gait disorder (09/14/2014); Headache; Hypercholesteremia; Hyperlipidemia; Liver disease; Memory difficulties (02/05/2015); Multiple allergies; MVP (mitral valve prolapse); Nocturnal leg cramps (09/14/2014); and Scoliosis. here with:  1. Memory disturbance 2. Dizziness  The patient's memory score is stable. MMSE  today is 27/30 was previously 30/30. Will continue on Exelon patch. Dizziness is stable. Advised patient that if her symptoms worsen or she develops any new symptoms she should let us know. Follow-up in 6 months or sooner if needed.     Ward Givens, MSN, NP-C 02/05/2016, 10:05 AM [facility] [address] [place], [place] [address] [phone]

## 2016-02-05 NOTE — Progress Notes (Signed)
I have read the note, and I agree with the clinical assessment and plan.  Laura Pollard,Laura Pollard   

## 2016-02-05 NOTE — Patient Instructions (Signed)
Memory score is stable Continue Exelon patch If your symptoms worsen or you develop new symptoms please let us know.

## 2016-02-27 DIAGNOSIS — H40013 Open angle with borderline findings, low risk, bilateral: Secondary | ICD-10-CM | POA: Diagnosis not present

## 2016-03-15 ENCOUNTER — Other Ambulatory Visit: Payer: Self-pay | Admitting: Neurology

## 2016-04-27 ENCOUNTER — Ambulatory Visit (INDEPENDENT_AMBULATORY_CARE_PROVIDER_SITE_OTHER): Payer: PPO | Admitting: Neurology

## 2016-04-27 ENCOUNTER — Telehealth: Payer: Self-pay | Admitting: Neurology

## 2016-04-27 ENCOUNTER — Encounter: Payer: Self-pay | Admitting: Neurology

## 2016-04-27 VITALS — BP 141/81 | HR 84 | Ht 68.0 in | Wt 149.5 lb

## 2016-04-27 DIAGNOSIS — S060X9S Concussion with loss of consciousness of unspecified duration, sequela: Secondary | ICD-10-CM

## 2016-04-27 DIAGNOSIS — R519 Headache, unspecified: Secondary | ICD-10-CM

## 2016-04-27 DIAGNOSIS — F0781 Postconcussional syndrome: Secondary | ICD-10-CM | POA: Diagnosis not present

## 2016-04-27 DIAGNOSIS — R413 Other amnesia: Secondary | ICD-10-CM

## 2016-04-27 DIAGNOSIS — R51 Headache: Secondary | ICD-10-CM | POA: Diagnosis not present

## 2016-04-27 DIAGNOSIS — R42 Dizziness and giddiness: Secondary | ICD-10-CM | POA: Diagnosis not present

## 2016-04-27 DIAGNOSIS — S060X9A Concussion with loss of consciousness of unspecified duration, initial encounter: Secondary | ICD-10-CM

## 2016-04-27 HISTORY — DX: Concussion with loss of consciousness of unspecified duration, initial encounter: S06.0X9A

## 2016-04-27 HISTORY — DX: Postconcussional syndrome: F07.81

## 2016-04-27 HISTORY — DX: Dizziness and giddiness: R42

## 2016-04-27 MED ORDER — MECLIZINE HCL 25 MG PO TABS: 12.5000 mg | ORAL_TABLET | Freq: Three times a day (TID) | ORAL | 2 refills | Status: DC | PRN

## 2016-04-27 NOTE — Patient Instructions (Signed)
   We will try meclizine for the vertigo and get CT of the head.

## 2016-04-27 NOTE — Telephone Encounter (Signed)
Pt has appt 3/29, she is wanting to be seen sooner. She advised she is having dizziness, trouble getting organized. Please call to work her in if possible.

## 2016-04-27 NOTE — Telephone Encounter (Signed)
Returned pt TC. Let her know that she could be worked in today if she can arrive by 12:15. She stated that she would try to get here in time.

## 2016-04-27 NOTE — Progress Notes (Signed)
Reason for visit: Dizziness  Laura Pollard is an 75 y.o. 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 The patient has been doing relatively well with her dizziness, but she fell and hit her head on cement on 01/13/2016. The patient lost consciousness briefly. The patient began having headaches following this, she is also had a different types of dizziness associated with true vertigo and nausea. The patient has not had any further falls. Unlike her other dizziness, her current vertigo is present with sitting, lying down, or standing. She denies any severe headaches, but the headaches are daily in nature and are mild since the bump to the head. She also has reported some decreased ability with concentration and cognitive processing. She had a pre-existing mild issues with memory. The patient did go to the hospital and underwent a CT scan of the brain that was unremarkable. She returns to this office for an evaluation.   Past Medical History:  Diagnosis Date  . Abnormal heart rhythm   . Anxiety   . Arthritis   . Atypical chest pain    normal ETT/Echo in 2003.. card cath normal 2010  . Depression   . 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 Headache   . Hypercholesteremia   . Hyperlipidemia   . Liver disease   . Memory difficulties 02/05/2015  . Multiple allergies   . MVP (mitral valve prolapse)   . Nocturnal leg cramps 09/14/2014  . Scoliosis     Past Surgical History:  Procedure Laterality Date  . CARDIAC CATHETERIZATION  2010   normal  . cataract surgery    . CESAREAN SECTION    . COLONOSCOPY    . ETT  2003   also ECHO..normal  . OVARIAN CYST SURGERY    . RETINAL DETACHMENT SURGERY Right 10 years ago  . TONSILLECTOMY      Family History  Problem Relation Age of Onset  . Heart disease Mother   . CAD Mother   . AAA (abdominal aortic  aneurysm) Father   . Bladder Cancer Father   . Kidney cancer Sister     metastasis to lung  . HIV Son     Social history:  reports that she quit smoking about 49 years ago. She has never used smokeless tobacco. She reports that she drinks alcohol. She reports that she does not use drugs.    Allergies  Allergen Reactions  . Indocin [Indomethacin] Other (See Comments)    Nausea and vomiting   . Keflex [Cephalexin] Other (See Comments)    unknown reaction   . Lipitor [Atorvastatin] Other (See Comments)    Muscle aches   . Pravastatin Other (See Comments)    Muscle aches   . Tape Rash    PAPER TAPE --- RASH    Medications:  Prior to Admission medications   Medication Sig Start Date End Date Taking? Authorizing Provider  ALPRAZolam [name]) 0.25 MG tablet Take 0.25 mg by mouth at bedtime as needed for anxiety.   Yes Historical Provider, MD  Calcium-Vitamin D-Vitamin K 500-100-40 MG-UNT-MCG CHEW Chew 1 tablet by mouth daily.   Yes Historical Provider, MD  cholecalciferol (VITAMIN D) 1000 UNITS tablet Take 2,000 Units by mouth daily.   Yes Historical Provider, MD  Coenzyme Q10 (CO Q 10 PO) Take 1 capsule by mouth daily.   Yes Historical Provider, MD  Cyanocobalamin  2500 MCG TABS Take 1 tablet by mouth daily.   Yes Historical Provider, MD  ezetimibe (ZETIA) 10 MG tablet Take 10 mg by mouth daily.   Yes Historical Provider, MD  FLUoxetine (PROZAC) 40 MG capsule Take 40 mg by mouth daily.   Yes Historical Provider, MD  Glucosamine HCl 1500 MG TABS Take 1 tablet by mouth 2 (two) times daily.   Yes Historical Provider, MD  GuaiFENesin (MUCINEX PO) Take by mouth as directed. No specified directions in paperwork.   Yes Historical Provider, MD  HYDROcodone-acetaminophen (NORCO/VICODIN) 5-325 MG tablet Take 1-2 tablets by mouth every 4 (four) hours as needed. 01/13/16  Yes Lacretia Leigh, MD  Ibuprofen (ADVIL PO) Take by mouth as directed. No specified directions in paperwork   Yes Historical  Provider, MD  LORazepam (ATIVAN) 0.5 MG tablet Take 0.5 mg by mouth at bedtime as needed for anxiety.   Yes Historical Provider, MD  methocarbamol (ROBAXIN) 500 MG tablet Take 1 tablet (500 mg total) by mouth 2 (two) times daily. 01/13/16  Yes Lacretia Leigh, MD  Omega 3 1200 MG CAPS Take 1 capsule by mouth daily.   Yes Historical Provider, MD  rivastigmine (EXELON) 4.6 mg/24hr PLACE 1 PATCH ONTO THE SKIN EVERY DAY 03/16/16  Yes Kathrynn Ducking, MD  Turmeric Curcumin 500 MG CAPS Take 1 capsule by mouth daily.   Yes Historical Provider, MD    ROS:  Out of a complete 14 system review of symptoms, the patient complains only of the following symptoms, and all other reviewed systems are negative.  Fatigue Runny nose Insomnia  Blood pressure (!) 141/81, pulse 84, height 5\' 8"  (1.727 m), weight 149 lb 8 oz (67.8 kg).  Physical Exam  General: The patient is alert and cooperative at the time of the examination.  Ears: Tympanic membranes are clear bilaterally  Skin: No significant peripheral edema is noted.   Neurologic Exam  Mental status: The patient is alert and oriented x 3 at the time of the examination. The patient has apparent normal recent and remote memory, with an apparently normal attention span and concentration ability.   Cranial nerves: Facial symmetry is present. Speech is normal, no aphasia or dysarthria is noted. Extraocular movements are full. Visual fields are full.  Motor: The patient has good strength in all 4 extremities.  Sensory examination: Soft touch sensation is symmetric on the face, arms, and legs.  Coordination: The patient has good finger-nose-finger and heel-to-shin bilaterally.  Gait and station: The patient has a normal gait. Tandem gait is slightly unsteady. Romberg is negative. No drift is seen.  Reflexes: Deep tendon reflexes are symmetric.   Assessment/Plan:  1. History of chronic dizziness  2. History of memory disorder  3. Concussion with  loss of consciousness  4. Posttraumatic vertigo  The patient has sustained a fall in September 2017 associated with loss of consciousness with onset of daily headaches, and vertigo. The patient has had some alteration in cognitive functioning as well. The patient likely suffered a significant concussion, and she has post-traumatic vertigo. The patient will be sent for a CT scan of the brain to exclude a small subdural hematoma. The patient will be placed on meclizine for the dizziness, she will follow-up in 3-4 months.  Jill Alexanders MD 04/27/2016 12:49 PM  [facility] [address] [place], [place] [address]  Phone [phone] Fax [phone]

## 2016-05-01 ENCOUNTER — Other Ambulatory Visit: Payer: Self-pay

## 2016-05-06 ENCOUNTER — Ambulatory Visit
Admission: RE | Admit: 2016-05-06 | Discharge: 2016-05-06 | Disposition: A | Discharge status: Home / Self Care | Payer: PPO | Source: Ambulatory Visit | Attending: Neurology | Admitting: Neurology

## 2016-05-06 DIAGNOSIS — R51 Headache: Secondary | ICD-10-CM | POA: Diagnosis not present

## 2016-05-06 DIAGNOSIS — G8929 Other chronic pain: Secondary | ICD-10-CM

## 2016-05-07 ENCOUNTER — Telehealth: Payer: Self-pay | Admitting: Neurology

## 2016-05-07 NOTE — Telephone Encounter (Signed)
I called the patient, CT the head shows no evidence of subdural hematoma following a fall. We will continue to treat the headaches and the vertigo.   CT head 05/07/16:  IMPRESSION:  This CT scan of the head without contrast shows the following: 1.   Mild to moderate cortical atrophy. 2.   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 3.   Right frontal soft tissue swelling noted on the previous CT scan has resolved. The study is otherwise unchanged.

## 2016-06-18 DIAGNOSIS — R42 Dizziness and giddiness: Secondary | ICD-10-CM | POA: Diagnosis not present

## 2016-06-18 DIAGNOSIS — R0982 Postnasal drip: Secondary | ICD-10-CM | POA: Diagnosis not present

## 2016-06-18 DIAGNOSIS — I959 Hypotension, unspecified: Secondary | ICD-10-CM | POA: Diagnosis not present

## 2016-08-06 ENCOUNTER — Telehealth: Payer: Self-pay | Admitting: Neurology

## 2016-08-06 ENCOUNTER — Ambulatory Visit (INDEPENDENT_AMBULATORY_CARE_PROVIDER_SITE_OTHER): Payer: PPO | Admitting: Neurology

## 2016-08-06 ENCOUNTER — Encounter: Payer: Self-pay | Admitting: Neurology

## 2016-08-06 VITALS — BP 134/79 | HR 84 | Ht 68.0 in | Wt 158.0 lb

## 2016-08-06 DIAGNOSIS — R42 Dizziness and giddiness: Secondary | ICD-10-CM | POA: Diagnosis not present

## 2016-08-06 DIAGNOSIS — F0781 Postconcussional syndrome: Secondary | ICD-10-CM | POA: Diagnosis not present

## 2016-08-06 NOTE — Telephone Encounter (Signed)
I will dictate a letter. 

## 2016-08-06 NOTE — Telephone Encounter (Signed)
Pt called said she needs a letter documenting she should not use a ladder due to dizziness. Please mail to the patient (address confirmed)

## 2016-08-06 NOTE — Telephone Encounter (Signed)
Pt would like to know if it is okay to work even if she has to kneel or go on a ladder, she works in [employer] (medical). Pt said a message can be left on her home# if she is not home

## 2016-08-06 NOTE — Patient Instructions (Signed)
   We will make a referral to Physical therapy for the post-concussion vertigo.

## 2016-08-06 NOTE — Telephone Encounter (Signed)
I called patient. I would not recommend comment heights on a ladder if she is feeling dizzy. Okay to kneal, if this causes dizziness, it is unlikely to result in a significant injury.

## 2016-08-06 NOTE — Progress Notes (Signed)
Reason for visit: Dizziness  Laura Pollard is an 76 y.o. 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 The patient unfortunately had a fall several months ago with a concussion associated with loss of consciousness. Since that time she has had a different type of dizziness, with some vertigo. The patient was initially treated with meclizine, but she is off this medication at this time. She reports that the dizziness has gotten some better but not all the way better from the initial head trauma. The patient feels slightly imbalance at times. She has not had any further falls. She reports some discomfort over the right brow that worsens with pressure. The patient indicates that the pain over the brow is not severe usually. The patient reports some right hip discomfort when she sleeps on her right side at nighttime. The patient is not sleeping well, usually not secondary to pain but more she believes related to anxiety issues. She returns to this office for an evaluation.  Past Medical History:  Diagnosis Date  . Abnormal heart rhythm   . Anxiety   . Arthritis   . Atypical chest pain    normal ETT/Echo in 2003.. card cath normal 2010  . Concussion with loss of consciousness 04/27/2016  . Depression   . 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 Headache   . Hypercholesteremia   . Hyperlipidemia   . Liver disease   . Memory difficulties 02/05/2015  . Multiple allergies   . MVP (mitral valve prolapse)   . Nocturnal leg cramps 09/14/2014  . 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 Scoliosis     Past Surgical History:  Procedure Laterality Date  . CARDIAC CATHETERIZATION  2010   normal  . cataract surgery    . CESAREAN SECTION    . COLONOSCOPY    . ETT  2003   also ECHO..normal  . OVARIAN CYST SURGERY    . RETINAL  DETACHMENT SURGERY Right 10 years ago  . TONSILLECTOMY      Family History  Problem Relation Age of Onset  . Heart disease Mother   . CAD Mother   . AAA (abdominal aortic aneurysm) Father   . Bladder Cancer Father   . Kidney cancer Sister     metastasis to lung  . HIV Son     Social history:  reports that she quit smoking about 50 years ago. She has never used smokeless tobacco. She reports that she drinks alcohol. She reports that she does not use drugs.    Allergies  Allergen Reactions  . Indocin [Indomethacin] Other (See Comments)    Nausea and vomiting   . Keflex [Cephalexin] Other (See Comments)    unknown reaction   . Lipitor [Atorvastatin] Other (See Comments)    Muscle aches   . Pravastatin Other (See Comments)    Muscle aches   . Tape Rash    PAPER TAPE --- RASH    Medications:  Prior to Admission medications   Medication Sig Start Date End Date Taking? Authorizing Provider  ALPRAZolam [name]) 0.25 MG tablet Take 0.25 mg by mouth at bedtime as needed for anxiety.   Yes Historical Provider, MD  Calcium-Vitamin D-Vitamin K 500-100-40 MG-UNT-MCG CHEW Chew 1 tablet by mouth daily.   Yes Historical Provider, MD  cholecalciferol (VITAMIN D) 1000 UNITS tablet Take 2,000 Units  by mouth daily.   Yes Historical Provider, MD  Coenzyme Q10 (CO Q 10 PO) Take 1 capsule by mouth daily.   Yes Historical Provider, MD  Cyanocobalamin 2500 MCG TABS Take 1 tablet by mouth daily.   Yes Historical Provider, MD  ezetimibe (ZETIA) 10 MG tablet Take 10 mg by mouth daily.   Yes Historical Provider, MD  FLUoxetine (PROZAC) 40 MG capsule Take 40 mg by mouth daily.   Yes Historical Provider, MD  Glucosamine HCl 1500 MG TABS Take 1 tablet by mouth 2 (two) times daily.   Yes Historical Provider, MD  GuaiFENesin (MUCINEX PO) Take by mouth as directed. No specified directions in paperwork.   Yes Historical Provider, MD  HYDROcodone-acetaminophen (NORCO/VICODIN) 5-325 MG tablet Take 1-2  tablets by mouth every 4 (four) hours as needed. 01/13/16  Yes Lacretia Leigh, MD  Ibuprofen (ADVIL PO) Take by mouth as directed. No specified directions in paperwork   Yes Historical Provider, MD  methocarbamol (ROBAXIN) 500 MG tablet Take 1 tablet (500 mg total) by mouth 2 (two) times daily. 01/13/16  Yes Lacretia Leigh, MD  Omega 3 1200 MG CAPS Take 1 capsule by mouth daily.   Yes Historical Provider, MD  rivastigmine (EXELON) 4.6 mg/24hr PLACE 1 PATCH ONTO THE SKIN EVERY DAY 03/16/16  Yes Kathrynn Ducking, MD  Turmeric Curcumin 500 MG CAPS Take 1 capsule by mouth daily.   Yes Historical Provider, MD    ROS:  Out of a complete 14 system review of symptoms, the patient complains only of the following symptoms, and all other reviewed systems are negative.  Facial swelling, runny nose Insomnia Dizziness  Blood pressure 134/79, pulse 84, height 5\' 8"  (1.727 m), weight 158 lb (71.7 kg).  Physical Exam  General: The patient is alert and cooperative at the time of the examination.  Skin: No significant peripheral edema is noted.   Neurologic Exam  Mental status: The patient is alert and oriented x 3 at the time of the examination. The patient has apparent normal recent and remote memory, with an apparently normal attention span and concentration ability.   Cranial nerves: Facial symmetry is present. Speech is normal, no aphasia or dysarthria is noted. Extraocular movements are full. Visual fields are full.  Motor: The patient has good strength in all 4 extremities.  Sensory examination: Soft touch sensation is symmetric on the face, arms, and legs.  Coordination: The patient has good finger-nose-finger and heel-to-shin bilaterally.  Gait and station: The patient has a normal gait. Tandem gait is minimally unsteady. Romberg is negative. No drift is seen.  Reflexes: Deep tendon reflexes are symmetric.   Assessment/Plan:  1. 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 Post concussive vertigo  3. 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 We will make a referral to physical therapy for this reason. She is no longer taking meclizine. 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 The patient will follow-up in 6 months. The discomfort associated with the right supraorbital neuropathy is not severe, we will not add medications for this at this time.  Jill Alexanders MD 08/06/2016 10:49 AM  [facility] [address] [place], [place] [address]  Phone [phone] Fax [phone]

## 2016-08-07 NOTE — Telephone Encounter (Signed)
Placed letter in mail for pt as requested.

## 2016-08-24 DIAGNOSIS — F3341 Major depressive disorder, recurrent, in partial remission: Secondary | ICD-10-CM | POA: Diagnosis not present

## 2017-02-08 ENCOUNTER — Ambulatory Visit (INDEPENDENT_AMBULATORY_CARE_PROVIDER_SITE_OTHER): Payer: PPO | Admitting: Neurology

## 2017-02-08 ENCOUNTER — Encounter: Payer: Self-pay | Admitting: Neurology

## 2017-02-08 VITALS — BP 152/80 | HR 74 | Ht 68.0 in | Wt 151.5 lb

## 2017-02-08 DIAGNOSIS — R269 Unspecified abnormalities of gait and mobility: Secondary | ICD-10-CM

## 2017-02-08 DIAGNOSIS — R42 Dizziness and giddiness: Secondary | ICD-10-CM | POA: Diagnosis not present

## 2017-02-08 DIAGNOSIS — R413 Other amnesia: Secondary | ICD-10-CM

## 2017-02-08 MED ORDER — RIVASTIGMINE 4.6 MG/24HR TD PT24: MEDICATED_PATCH | TRANSDERMAL | 11 refills | Status: DC

## 2017-02-08 NOTE — Progress Notes (Signed)
Reason for visit: Dizziness, memory disturbance  Laura Pollard is an 76 y.o. female  History of present illness:  Laura Pollard is a 76 year old right-handed white female with a history of a mild memory disturbance, she is on the Exelon patch. She is tolerating the patch well. She continues to report some short-term memory problems that have not significantly progressed. 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 She fell 01/13/16 and sustained a concussion, she has had some posttraumatic vertigo since that time but this has not completely resolved. She is continuing to work, she is remaining off of ladders. She has not had any further falls. She returns for an evaluation.  Past Medical History:  Diagnosis Date  . Abnormal heart rhythm   . Anxiety   . Arthritis   . Atypical chest pain    normal ETT/Echo in 2003.. card cath normal 2010  . Concussion with loss of consciousness 04/27/2016  . Depression   . 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 Headache   . Hypercholesteremia   . Hyperlipidemia   . Liver disease   . Memory difficulties 02/05/2015  . Multiple allergies   . MVP (mitral valve prolapse)   . Nocturnal leg cramps 09/14/2014  . 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 Scoliosis     Past Surgical History:  Procedure Laterality Date  . CARDIAC CATHETERIZATION  2010   normal  . cataract surgery    . CESAREAN SECTION    . COLONOSCOPY    . ETT  2003   also ECHO..normal  . OVARIAN CYST SURGERY    . RETINAL DETACHMENT SURGERY Right 10 years ago  . TONSILLECTOMY      Family History  Problem Relation Age of Onset  . Heart disease Mother   . CAD Mother   . AAA (abdominal aortic aneurysm) Father   . Bladder Cancer Father   . Kidney cancer Sister        metastasis to lung  . HIV Son     Social history:  reports that she quit smoking about 50 years ago. She has never used smokeless tobacco. She reports  that she drinks alcohol. She reports that she does not use drugs.    Allergies  Allergen Reactions  . Indocin [Indomethacin] Other (See Comments)    Nausea and vomiting   . Keflex [Cephalexin] Other (See Comments)    unknown reaction   . Lipitor [Atorvastatin] Other (See Comments)    Muscle aches   . Pravastatin Other (See Comments)    Muscle aches   . Tape Rash    PAPER TAPE --- RASH    Medications:  Prior to Admission medications   Medication Sig Start Date End Date Taking? Authorizing Provider  ALPRAZolam [name]) 0.25 MG tablet Take 0.25 mg by mouth at bedtime as needed for anxiety.   Yes [provider]  Calcium-Vitamin D-Vitamin K 500-100-40 MG-UNT-MCG CHEW Chew 1 tablet by mouth daily.   Yes [provider]  cholecalciferol (VITAMIN D) 1000 UNITS tablet Take 2,000 Units by mouth daily.   Yes [provider]  Coenzyme Q10 (CO Q 10 PO) Take 1 capsule by mouth daily.   Yes [provider]  Cyanocobalamin 2500 MCG TABS Take 1 tablet by mouth daily.   Yes [provider]  ezetimibe (ZETIA) 10 MG tablet Take 10 mg by mouth daily.   Yes [provider]  FLUoxetine (PROZAC) 40 MG capsule Take  40 mg by mouth daily.   Yes [provider]  Glucosamine HCl 1500 MG TABS Take 1 tablet by mouth 2 (two) times daily.   Yes [provider]  GuaiFENesin (MUCINEX PO) Take by mouth as directed. No specified directions in paperwork.   Yes [provider]  Ibuprofen (ADVIL PO) Take by mouth as directed. No specified directions in paperwork   Yes [provider]  Omega 3 1200 MG CAPS Take 1 capsule by mouth daily.   Yes [provider]  rivastigmine (EXELON) 4.6 mg/24hr PLACE 1 PATCH ONTO THE SKIN EVERY DAY 03/16/16  Yes Kathrynn Ducking, MD  Turmeric Curcumin 500 MG CAPS Take 1 capsule by mouth daily.   Yes [provider]    ROS:  Out of a complete 14 system review of symptoms, the  patient complains only of the following symptoms, and all other reviewed systems are negative.  Fatigue Runny nose Heat intolerance Memory loss, dizziness  Blood pressure (!) 152/80, pulse 74, height 5\' 8"  (1.727 m), weight 151 lb 8 oz (68.7 kg).  Physical Exam  General: The patient is alert and cooperative at the time of the examination.  Skin: No significant peripheral edema is noted.   Neurologic Exam  Mental status: The patient is alert and oriented x 3 at the time of the examination. The patient has apparent normal recent and remote memory, with an apparently normal attention span and concentration ability.   Cranial nerves: Facial symmetry is present. Speech is normal, no aphasia or dysarthria is noted. Extraocular movements are full. Visual fields are full.  Motor: The patient has good strength in all 4 extremities.  Sensory examination: Soft touch sensation is symmetric on the face, arms, and legs.  Coordination: The patient has good finger-nose-finger and heel-to-shin bilaterally.  Gait and station: The patient has a minimally wide-based gait, tandem gait is unsteady. Romberg is negative. No drift is seen.  Reflexes: Deep tendon reflexes are symmetric.   Assessment/Plan:  1. Mild memory disturbance  2. Chronic dizziness, gait instability  The patient will continue the Exelon patch, a prescription was sent in. We will follow-up with the memory over time. She will return to office in 9 months.  Jill Alexanders MD 02/08/2017 4:05 PM  [facility] [address] [place], [place] [address]  Phone [phone] Fax [phone]

## 2017-03-12 DIAGNOSIS — Z881 Allergy status to other antibiotic agents status: Secondary | ICD-10-CM

## 2017-03-12 DIAGNOSIS — I341 Nonrheumatic mitral (valve) prolapse: Secondary | ICD-10-CM | POA: Diagnosis not present

## 2017-03-12 DIAGNOSIS — Z888 Allergy status to other drugs, medicaments and biological substances status: Secondary | ICD-10-CM | POA: Diagnosis not present

## 2017-03-12 DIAGNOSIS — I110 Hypertensive heart disease with heart failure: Secondary | ICD-10-CM | POA: Diagnosis not present

## 2017-03-12 DIAGNOSIS — F419 Anxiety disorder, unspecified: Secondary | ICD-10-CM | POA: Diagnosis not present

## 2017-03-12 DIAGNOSIS — R531 Weakness: Secondary | ICD-10-CM | POA: Diagnosis not present

## 2017-03-12 DIAGNOSIS — I726 Aneurysm of vertebral artery: Secondary | ICD-10-CM | POA: Diagnosis not present

## 2017-03-12 DIAGNOSIS — Z87891 Personal history of nicotine dependence: Secondary | ICD-10-CM | POA: Diagnosis not present

## 2017-03-12 DIAGNOSIS — F329 Major depressive disorder, single episode, unspecified: Secondary | ICD-10-CM | POA: Diagnosis not present

## 2017-03-12 DIAGNOSIS — I639 Cerebral infarction, unspecified: Secondary | ICD-10-CM | POA: Diagnosis not present

## 2017-03-12 DIAGNOSIS — G459 Transient cerebral ischemic attack, unspecified: Secondary | ICD-10-CM | POA: Diagnosis not present

## 2017-03-12 DIAGNOSIS — G500 Trigeminal neuralgia: Secondary | ICD-10-CM | POA: Diagnosis not present

## 2017-03-12 DIAGNOSIS — Z91048 Other nonmedicinal substance allergy status: Secondary | ICD-10-CM

## 2017-03-12 DIAGNOSIS — Z8052 Family history of malignant neoplasm of bladder: Secondary | ICD-10-CM | POA: Diagnosis not present

## 2017-03-12 DIAGNOSIS — R410 Disorientation, unspecified: Secondary | ICD-10-CM | POA: Diagnosis not present

## 2017-03-12 DIAGNOSIS — E785 Hyperlipidemia, unspecified: Secondary | ICD-10-CM | POA: Diagnosis present

## 2017-03-12 DIAGNOSIS — Z8051 Family history of malignant neoplasm of kidney: Secondary | ICD-10-CM

## 2017-03-12 DIAGNOSIS — Z79899 Other long term (current) drug therapy: Secondary | ICD-10-CM | POA: Diagnosis not present

## 2017-03-12 DIAGNOSIS — Z8249 Family history of ischemic heart disease and other diseases of the circulatory system: Secondary | ICD-10-CM

## 2017-03-12 DIAGNOSIS — I509 Heart failure, unspecified: Secondary | ICD-10-CM | POA: Diagnosis present

## 2017-03-12 DIAGNOSIS — G8194 Hemiplegia, unspecified affecting left nondominant side: Secondary | ICD-10-CM | POA: Diagnosis not present

## 2017-03-12 DIAGNOSIS — R200 Anesthesia of skin: Secondary | ICD-10-CM | POA: Diagnosis not present

## 2017-03-13 ENCOUNTER — Inpatient Hospital Stay (HOSPITAL_COMMUNITY): Payer: PPO

## 2017-03-13 ENCOUNTER — Emergency Department (HOSPITAL_COMMUNITY): Payer: PPO

## 2017-03-13 ENCOUNTER — Encounter (HOSPITAL_COMMUNITY): Payer: Self-pay | Admitting: Emergency Medicine

## 2017-03-13 ENCOUNTER — Inpatient Hospital Stay (HOSPITAL_COMMUNITY)
Admission: EM | Admit: 2017-03-13 | Discharge: 2017-03-13 | DRG: 069 | Disposition: A | Discharge status: Home / Self Care | Payer: PPO | Attending: Internal Medicine | Admitting: Internal Medicine

## 2017-03-13 DIAGNOSIS — G459 Transient cerebral ischemic attack, unspecified: Principal | ICD-10-CM | POA: Diagnosis present

## 2017-03-13 DIAGNOSIS — Z888 Allergy status to other drugs, medicaments and biological substances status: Secondary | ICD-10-CM | POA: Diagnosis not present

## 2017-03-13 DIAGNOSIS — Z8051 Family history of malignant neoplasm of kidney: Secondary | ICD-10-CM | POA: Diagnosis not present

## 2017-03-13 DIAGNOSIS — G8194 Hemiplegia, unspecified affecting left nondominant side: Secondary | ICD-10-CM | POA: Diagnosis not present

## 2017-03-13 DIAGNOSIS — F419 Anxiety disorder, unspecified: Secondary | ICD-10-CM

## 2017-03-13 DIAGNOSIS — Z8249 Family history of ischemic heart disease and other diseases of the circulatory system: Secondary | ICD-10-CM | POA: Diagnosis not present

## 2017-03-13 DIAGNOSIS — I726 Aneurysm of vertebral artery: Secondary | ICD-10-CM

## 2017-03-13 DIAGNOSIS — R531 Weakness: Secondary | ICD-10-CM | POA: Diagnosis not present

## 2017-03-13 DIAGNOSIS — I6789 Other cerebrovascular disease: Secondary | ICD-10-CM | POA: Diagnosis not present

## 2017-03-13 DIAGNOSIS — Z881 Allergy status to other antibiotic agents status: Secondary | ICD-10-CM | POA: Diagnosis not present

## 2017-03-13 DIAGNOSIS — Z79899 Other long term (current) drug therapy: Secondary | ICD-10-CM | POA: Diagnosis not present

## 2017-03-13 DIAGNOSIS — G500 Trigeminal neuralgia: Secondary | ICD-10-CM

## 2017-03-13 DIAGNOSIS — I110 Hypertensive heart disease with heart failure: Secondary | ICD-10-CM | POA: Diagnosis not present

## 2017-03-13 DIAGNOSIS — R410 Disorientation, unspecified: Secondary | ICD-10-CM | POA: Diagnosis not present

## 2017-03-13 DIAGNOSIS — I639 Cerebral infarction, unspecified: Secondary | ICD-10-CM | POA: Diagnosis not present

## 2017-03-13 DIAGNOSIS — I509 Heart failure, unspecified: Secondary | ICD-10-CM | POA: Diagnosis not present

## 2017-03-13 DIAGNOSIS — Z87891 Personal history of nicotine dependence: Secondary | ICD-10-CM | POA: Diagnosis not present

## 2017-03-13 DIAGNOSIS — I341 Nonrheumatic mitral (valve) prolapse: Secondary | ICD-10-CM | POA: Diagnosis not present

## 2017-03-13 DIAGNOSIS — Z91048 Other nonmedicinal substance allergy status: Secondary | ICD-10-CM | POA: Diagnosis not present

## 2017-03-13 DIAGNOSIS — R200 Anesthesia of skin: Secondary | ICD-10-CM | POA: Diagnosis not present

## 2017-03-13 DIAGNOSIS — F329 Major depressive disorder, single episode, unspecified: Secondary | ICD-10-CM | POA: Diagnosis not present

## 2017-03-13 DIAGNOSIS — Z8052 Family history of malignant neoplasm of bladder: Secondary | ICD-10-CM | POA: Diagnosis not present

## 2017-03-13 DIAGNOSIS — E785 Hyperlipidemia, unspecified: Secondary | ICD-10-CM | POA: Diagnosis not present

## 2017-03-13 LAB — COMPREHENSIVE METABOLIC PANEL
ALT: 41 U/L (ref 14–54)
ANION GAP: 9 (ref 5–15)
AST: 32 U/L (ref 15–41)
Albumin: 4.4 g/dL (ref 3.5–5.0)
Alkaline Phosphatase: 65 U/L (ref 38–126)
BUN: 20 mg/dL (ref 6–20)
CHLORIDE: 106 mmol/L (ref 101–111)
CO2: 25 mmol/L (ref 22–32)
Calcium: 9.8 mg/dL (ref 8.9–10.3)
Creatinine, Ser: 0.52 mg/dL (ref 0.44–1.00)
GFR calc Af Amer: >60 mL/min (ref >60)
GFR calc non Af Amer: >60 mL/min (ref >60)
Glucose, Bld: 104 mg/dL — ABNORMAL HIGH (ref 65–99)
Potassium: 4.1 mmol/L (ref 3.5–5.1)
Sodium: 140 mmol/L (ref 135–145)
Total Bilirubin: 0.8 mg/dL (ref 0.3–1.2)
Total Protein: 7.4 g/dL (ref 6.5–8.1)

## 2017-03-13 LAB — POCT I-STAT TROPONIN I: Troponin i, poc: 0 ng/mL (ref 0.00–0.08)

## 2017-03-13 LAB — CBC
HCT: 39.9 % (ref 36.0–46.0)
HEMOGLOBIN: 13.5 g/dL (ref 12.0–15.0)
MCH: 30 pg (ref 26.0–34.0)
MCHC: 33.8 g/dL (ref 30.0–36.0)
MCV: 88.7 fL (ref 78.0–100.0)
Platelets: 166 10*3/uL (ref 150–400)
RBC: 4.5 MIL/uL (ref 3.87–5.11)
RDW: 13.8 % (ref 11.5–15.5)
WBC: 6.8 10*3/uL (ref 4.0–10.5)

## 2017-03-13 LAB — LIPID PANEL
Cholesterol: 185 mg/dL (ref 0–200)
HDL: 44 mg/dL (ref >40)
LDL CALC: 113 mg/dL — AB (ref 0–99)
Total CHOL/HDL Ratio: 4.2 RATIO
Triglycerides: 142 mg/dL (ref <150)
VLDL: 28 mg/dL (ref 0–40)

## 2017-03-13 LAB — ECHOCARDIOGRAM COMPLETE
Height: 68 in
WEIGHTICAEL: 2400 [oz_av]

## 2017-03-13 LAB — HEMOGLOBIN A1C
HEMOGLOBIN A1C: 5.5 % (ref 4.8–5.6)
MEAN PLASMA GLUCOSE: 111.15 mg/dL

## 2017-03-13 MED ORDER — IOPAMIDOL (ISOVUE-370) INJECTION 76%
INTRAVENOUS | Status: AC
  Administered 2017-03-13: 50 mL
  Filled 2017-03-13: qty 50

## 2017-03-13 MED ORDER — STROKE: EARLY STAGES OF RECOVERY BOOK
Freq: Once | Status: DC
  Filled 2017-03-13: qty 1

## 2017-03-13 MED ORDER — ASPIRIN 325 MG PO TABS
325.0000 mg | ORAL_TABLET | Freq: Every day | ORAL | Status: DC
  Administered 2017-03-13: 325 mg via ORAL
  Filled 2017-03-13: qty 1

## 2017-03-13 MED ORDER — GABAPENTIN 100 MG PO CAPS
100.0000 mg | ORAL_CAPSULE | Freq: Two times a day (BID) | ORAL | Status: DC
  Administered 2017-03-13: 100 mg via ORAL
  Filled 2017-03-13: qty 1

## 2017-03-13 MED ORDER — OMEGA-3-ACID ETHYL ESTERS 1 G PO CAPS
1.0000 | ORAL_CAPSULE | Freq: Every day | ORAL | Status: DC
  Administered 2017-03-13: 1 g via ORAL
  Filled 2017-03-13: qty 1

## 2017-03-13 MED ORDER — ALPRAZOLAM 0.25 MG PO TABS: 0.2500 mg | ORAL_TABLET | Freq: Every evening | ORAL | Status: DC | PRN

## 2017-03-13 MED ORDER — VITAMIN B-12 1000 MCG PO TABS
2500.0000 ug | ORAL_TABLET | Freq: Every day | ORAL | Status: DC
  Administered 2017-03-13: 2500 ug via ORAL
  Filled 2017-03-13: qty 3

## 2017-03-13 MED ORDER — ACETAMINOPHEN 650 MG RE SUPP
650.0000 mg | RECTAL | Status: DC | PRN
Start: 2017-03-13 — End: 2017-03-13

## 2017-03-13 MED ORDER — ACETAMINOPHEN 160 MG/5ML PO SOLN: 650.0000 mg | ORAL | Status: DC | PRN

## 2017-03-13 MED ORDER — ASPIRIN 300 MG RE SUPP: 300.0000 mg | Freq: Every day | RECTAL | Status: DC

## 2017-03-13 MED ORDER — FLUOXETINE HCL 20 MG PO CAPS
40.0000 mg | ORAL_CAPSULE | Freq: Every day | ORAL | Status: DC
  Administered 2017-03-13: 40 mg via ORAL
  Filled 2017-03-13: qty 2

## 2017-03-13 MED ORDER — ACETAMINOPHEN 325 MG PO TABS: 650.0000 mg | ORAL_TABLET | ORAL | Status: DC | PRN

## 2017-03-13 MED ORDER — EZETIMIBE 10 MG PO TABS
10.0000 mg | ORAL_TABLET | Freq: Every day | ORAL | Status: DC
  Administered 2017-03-13: 10 mg via ORAL
  Filled 2017-03-13: qty 1

## 2017-03-13 MED ORDER — ASPIRIN 81 MG PO TBEC: 81.0000 mg | DELAYED_RELEASE_TABLET | Freq: Every day | ORAL | 1 refills | Status: DC

## 2017-03-13 MED ORDER — ASPIRIN EC 81 MG PO TBEC: 81.0000 mg | DELAYED_RELEASE_TABLET | Freq: Every day | ORAL | Status: DC

## 2017-03-13 MED ORDER — GABAPENTIN 100 MG PO CAPS: 100.0000 mg | ORAL_CAPSULE | Freq: Two times a day (BID) | ORAL | 1 refills | Status: DC

## 2017-03-13 MED ORDER — VITAMIN D 1000 UNITS PO TABS
2000.0000 [IU] | ORAL_TABLET | Freq: Every day | ORAL | Status: DC
  Administered 2017-03-13: 2000 [IU] via ORAL
  Filled 2017-03-13: qty 2

## 2017-03-13 MED ORDER — CALCIUM CARBONATE-VITAMIN D 500-200 MG-UNIT PO TABS
1.0000 | ORAL_TABLET | Freq: Every day | ORAL | Status: DC
  Administered 2017-03-13: 16:00:00 1 via ORAL
  Filled 2017-03-13: qty 1

## 2017-03-13 MED ORDER — RIVASTIGMINE 4.6 MG/24HR TD PT24
4.6000 mg | MEDICATED_PATCH | Freq: Every day | TRANSDERMAL | Status: DC
  Administered 2017-03-13: 4.6 mg via TRANSDERMAL
  Filled 2017-03-13: qty 1

## 2017-03-13 MED ORDER — ENOXAPARIN SODIUM 40 MG/0.4ML ~~LOC~~ SOLN
40.0000 mg | SUBCUTANEOUS | Status: DC
  Administered 2017-03-13: 40 mg via SUBCUTANEOUS
  Filled 2017-03-13: qty 0.4

## 2017-03-13 NOTE — ED Notes (Signed)
Pt to MRI then will be transported to floor

## 2017-03-13 NOTE — Evaluation (Signed)
Physical Therapy Evaluation Patient Details Name: Laura Pollard MRN: [id number] DOB: 08-Aug-1940 Today's Date: 03/13/2017   History of Present Illness  Pt is a 76 y/o female admitted secondary to L sided weakness. MRI was negative for any acute findings. PMH including but not limited to HLD and dolichoectatic calcified R vertebral artery.  Clinical Impression  Pt presented supine in bed with HOB elevated, awake and willing to participate in therapy session. Prior to admission, pt reported that she was independent with all functional mobility and ADLs. Pt lives with her spouse who can provide 24/7 supervision/assistance if needed. Pt ambulated in hallway without use of an AD with close min guard to supervision for safety. Pt with reports of dizziness after ambulating ~150' and after head turns. Dizziness subsided after a standing rest break of ~30 seconds. Pt successfully completed stair training this session as well. No further acute PT needs at this time. Pt would benefit from an Outpatient Physical Therapy evaluation to address her L sided weakness and balance.     Follow Up Recommendations Outpatient PT    Equipment Recommendations  None recommended by PT    Recommendations for Other Services       Precautions / Restrictions Precautions Precautions: Fall Restrictions Weight Bearing Restrictions: No      Mobility  Bed Mobility Overal bed mobility: Modified Independent                Transfers Overall transfer level: Modified independent Equipment used: None                Ambulation/Gait Ambulation/Gait assistance: Supervision;Min guard Ambulation Distance (Feet): 200 Feet Assistive device: None Gait Pattern/deviations: Step-through pattern;Decreased stride length;Drifts right/left Gait velocity: WFL Gait velocity interpretation: at or above normal speed for age/gender General Gait Details: pt with reported dizziness after ambulating ~150' and after head  turns, requiring a standing rest break. Close min guard to supervision for safety throughout  Stairs Stairs: Yes Stairs assistance: Min guard Stair Management: Two rails;Alternating pattern;Forwards Number of Stairs: 2 General stair comments: min guard for safety  Wheelchair Mobility    Modified Rankin (Stroke Patients Only)       Balance Overall balance assessment: Needs assistance;History of Falls Sitting-balance support: Feet supported Sitting balance-Leahy Scale: Normal     Standing balance support: During functional activity;No upper extremity supported Standing balance-Leahy Scale: Fair                               Pertinent Vitals/Pain Pain Assessment: No/denies pain    Home Living Family/patient expects to be discharged to:: Private residence Living Arrangements: Spouse/significant other Available Help at Discharge: Family;Available 24 hours/day Type of Home: House Home Access: Stairs to enter Entrance Stairs-Rails: None Entrance Stairs-Number of Steps: 2 Home Layout: Two level;Able to live on main level with bedroom/bathroom Home Equipment: Walker - 2 wheels      Prior Function Level of Independence: Independent               Hand Dominance   Dominant Hand: Right    Extremity/Trunk Assessment   Upper Extremity Assessment Upper Extremity Assessment: Defer to OT evaluation    Lower Extremity Assessment Lower Extremity Assessment: LLE deficits/detail LLE Deficits / Details: MMT revealed 4/5 for hip flexion, knee flexion, knee extension and ankle DF>       Communication   Communication: No difficulties  Cognition Arousal/Alertness: Awake/alert Behavior During Therapy: WFL for tasks assessed/performed  Overall Cognitive Status: Impaired/Different from baseline Area of Impairment: Attention;Memory                   Current Attention Level: Focused Memory: Decreased short-term memory         General Comments: pt  with reported "problems with memory" at baseline. pt very easily distracted throughout session requiring frequent redirection to maintain attention to task      General Comments      Exercises     Assessment/Plan    PT Assessment All further PT needs can be met in the next venue of care  PT Problem List Decreased strength;Decreased balance;Decreased mobility;Decreased coordination;Decreased cognition;Decreased safety awareness       PT Treatment Interventions      PT Goals (Current goals can be found in the Care Plan section)  Acute Rehab PT Goals Patient Stated Goal: increased strength on L     Frequency     Barriers to discharge        Co-evaluation               AM-PAC PT "6 Clicks" Daily Activity  Outcome Measure Difficulty turning over in bed (including adjusting bedclothes, sheets and blankets)?: None Difficulty moving from lying on back to sitting on the side of the bed? : None Difficulty sitting down on and standing up from a chair with arms (e.g., wheelchair, bedside commode, etc,.)?: None Help needed moving to and from a bed to chair (including a wheelchair)?: None Help needed walking in hospital room?: A Little Help needed climbing 3-5 steps with a railing? : A Little 6 Click Score: 22    End of Session   Activity Tolerance: Patient tolerated treatment well Patient left: in chair;with call bell/phone within reach;with chair alarm set;with family/visitor present Nurse Communication: Mobility status PT Visit Diagnosis: Other symptoms and signs involving the nervous system (R29.898)    Time: 1020-1045 PT Time Calculation (min) (ACUTE ONLY): 25 min   Charges:   PT Evaluation $PT Eval Moderate Complexity: 1 Mod PT Treatments $Gait Training: 8-22 mins   PT G Codes:        Britton, PT, DPT [place] 03/13/2017, 12:08 PM

## 2017-03-13 NOTE — Progress Notes (Signed)
Patient ready for discharge to home; discharge instructions given and reviewed; Rx given; patient discharged out via wheelchair; accompanied home by her husband. 

## 2017-03-13 NOTE — ED Provider Notes (Signed)
76 year old female transferred from [facility] emergency department as a code stroke.  Seen and evaluated by Dr. Leonel Ramsay of neurology service who feels this is a subacute stroke and request she be admitted to hospitalist service.  She is complaining of left-sided numbness.  She has some mild left-sided weakness.  She will be admitted to the hospitalist service to continue remainder of stroke workup.  Medical screening examination/treatment/procedure(s) were conducted as a shared visit with non-physician practitioner(s) and myself.  I personally evaluated the patient during the encounter.   EKG Interpretation  Date/Time:  Saturday March 13 2017 00:27:43 EDT Ventricular Rate:  60 PR Interval:    QRS Duration: 179 QT Interval:  398 QTC Calculation: 395 R Axis:   11 Text Interpretation:  Sinus rhythm Nonspecific intraventricular conduction delay Borderline abnrm T, anterolateral leads No significant change since last tracing Confirmed by Wandra Arthurs [phone]) on 03/13/2017 5:95:63 AM         Delora Fuel, MD 87/56/43 2251

## 2017-03-13 NOTE — Evaluation (Signed)
Clinical/Bedside Swallow Evaluation Patient Details  Name: Laura Pollard MRN: [id number] Date of Birth: June 16, 1940  Today's Date: 03/13/2017 Time: SLP Start Time (ACUTE ONLY): 0810 SLP Stop Time (ACUTE ONLY): 0822 SLP Time Calculation (min) (ACUTE ONLY): 12 min  Past Medical History:  Past Medical History:  Diagnosis Date  . Abnormal heart rhythm   . Anxiety   . Arthritis   . Atypical chest pain    normal ETT/Echo in 2003.. card cath normal 2010  . Concussion with loss of consciousness 04/27/2016  . Depression   . 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 Headache   . Hypercholesteremia   . Hyperlipidemia   . Liver disease   . Memory difficulties 02/05/2015  . Multiple allergies   . MVP (mitral valve prolapse)   . Nocturnal leg cramps 09/14/2014  . 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 Scoliosis    Past Surgical History:  Past Surgical History:  Procedure Laterality Date  . CARDIAC CATHETERIZATION  2010   normal  . cataract surgery    . CESAREAN SECTION    . COLONOSCOPY    . ETT  2003   also ECHO..normal  . OVARIAN CYST SURGERY    . RETINAL DETACHMENT SURGERY Right 10 years ago  . TONSILLECTOMY     HPI:  Laura A Schmidtis a 76 y.o.femalewith medical history significant of HLD, dolichoectatic calcified RIGHT vertebral artery, gait disorder, memory difficulties, concussion with loss of consciousness. Patient presented to the ED with c/o acute onset of L sided weakness and numbness.Associated feeling of choking with food. MRI revealed no acute findings. MRA stable R vertebral artery aneurysm, 69mm R paraopthalmic aneurysm.    Assessment / Plan / Recommendation Clinical Impression  Patient presents with oropharygneal swallow appearing within functional limits with adequate airway protection at bedside. No overt signs of aspiration observed despite challenging with consecutive straw sips of thin liquids in excess of 3 oz. Pt denies difficulties with swallowing. Reports mild left  sided facial numbness; otherwise oral mechanism examination is unremarkable. Recommend regular diet with thin liquids, no restrictions or further skilled ST for dysphagia recommended at this time. Will s/o for dysphagia. Please see cognitive-linguistic evaluation for additional details. SLP Visit Diagnosis: Dysphagia, unspecified (R13.10)    Aspiration Risk  No limitations    Diet Recommendation Regular;Thin liquid   Liquid Administration via: Cup;Straw Medication Administration: Whole meds with liquid Supervision: Patient able to self feed Compensations: Slow rate;Small sips/bites Postural Changes: Seated upright at 90 degrees    Other  Recommendations Oral Care Recommendations: Oral care BID Other Recommendations: Clarify dietary restrictions   Follow up Recommendations None      Frequency and Duration            Prognosis Prognosis for Safe Diet Advancement: Good      Swallow Study   General Date of Onset: 03/13/17 HPI: Laura A Schmidtis a 76 y.o.femalewith medical history significant of HLD, dolichoectatic calcified RIGHT vertebral artery, gait disorder, memory difficulties, concussion with loss of consciousness. Patient presented to the ED with c/o acute onset of L sided weakness and numbness.Associated feeling of choking with food. MRI revealed no acute findings. MRA stable R vertebral artery aneurysm, 49mm R paraopthalmic aneurysm.  Type of Study: Bedside Swallow Evaluation Previous Swallow Assessment: none in chart Diet Prior to this Study: NPO Temperature Spikes Noted: No Respiratory Status: Room air History of Recent Intubation: No Behavior/Cognition: Alert;Cooperative Oral Cavity Assessment: Within Functional Limits Oral Care Completed by SLP: Yes Oral Cavity - Dentition: Adequate natural  dentition Vision: Functional for self-feeding Self-Feeding Abilities: Able to feed self Patient Positioning: Upright in bed Baseline Vocal Quality: Normal Volitional  Cough: Strong Volitional Swallow: Able to elicit    Oral/Motor/Sensory Function Overall Oral Motor/Sensory Function: Mild impairment Facial ROM: Within Functional Limits Facial Symmetry: Within Functional Limits Facial Strength: Within Functional Limits Facial Sensation: Reduced left Lingual ROM: Within Functional Limits Lingual Symmetry: Within Functional Limits Lingual Strength: Within Functional Limits Lingual Sensation: Within Functional Limits Velum: Within Functional Limits Mandible: Within Functional Limits   Ice Chips Ice chips: Not tested   Thin Liquid Thin Liquid: Within functional limits Presentation: Cup;Self Fed;Straw    Nectar Thick Nectar Thick Liquid: Not tested   Honey Thick Honey Thick Liquid: Not tested   Puree Puree: Within functional limits Presentation: Self Fed;Spoon   Solid   GO   Solid: Within functional limits Presentation: [place], [place], [place] Speech-Language Pathologist [phone]  Aliene Altes 03/13/2017,8:48 AM

## 2017-03-13 NOTE — ED Triage Notes (Signed)
Patient complaining of numbness of left face numbness and arm numbness around 11 pm. Patient also has some confusion earlier in the day. Patient says she has not felt good all day.

## 2017-03-13 NOTE — Evaluation (Signed)
Speech Language Pathology Evaluation Patient Details Name: Laura Pollard MRN: [id number] DOB: [birth year] Today's Date: 03/13/2017 Time: [id number] SLP Time Calculation (min) (ACUTE ONLY): 17 min  Problem List:  Patient Active Problem List   Diagnosis Date Noted  . Acute ischemic stroke ([place]) 03/13/2017  . Concussion with loss of consciousness 04/27/2016  . 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 Memory difficulties 02/05/2015  . 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 Dizziness and giddiness 09/14/2014  . Nocturnal leg cramps 09/14/2014   Past Medical History:  Past Medical History:  Diagnosis Date  . Abnormal heart rhythm   . Anxiety   . Arthritis   . Atypical chest pain    normal ETT/Echo in 2003.. card cath normal 2010  . Concussion with loss of consciousness 04/27/2016  . Depression   . 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 Headache   . Hypercholesteremia   . Hyperlipidemia   . Liver disease   . Memory difficulties 02/05/2015  . Multiple allergies   . MVP (mitral valve prolapse)   . Nocturnal leg cramps 09/14/2014  . 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 Scoliosis    Past Surgical History:  Past Surgical History:  Procedure Laterality Date  . CARDIAC CATHETERIZATION  2010   normal  . cataract surgery    . CESAREAN SECTION    . COLONOSCOPY    . ETT  2003   also ECHO..normal  . OVARIAN CYST SURGERY    . RETINAL DETACHMENT SURGERY Right 10 years ago  . TONSILLECTOMY     HPI:  Laura A Schmidtis a 76 y.o.femalewith medical history significant of HLD, dolichoectatic calcified RIGHT vertebral artery, gait disorder, memory difficulties, concussion with loss of consciousness. Patient presented to the ED with c/o acute onset of L sided weakness and numbness.Associated feeling of choking with food. MRI revealed no acute findings. MRA stable R vertebral artery aneurysm, 13mm R paraopthalmic aneurysm.    Assessment / Plan / Recommendation Clinical Impression  Patient appears to be  functioning at her baseline. Mild articulation impairment is her baseline. 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 Followed by Dr. Jannifer Franklin in neuro clinic. She presents today with anomia intermittently in conversation. MMSE administered and pt scored 25/30, within normal limits; difficulty with serial subtraction (1/5). She is still driving though she reports she does not feel confident. Pt lives with and helps care for her husband, who is ill. She manages her own medications. She may benefit from follow-up with SLP in OP or [facility] services for cognition. No further acute needs identified; SLP will s/o.     SLP Assessment  SLP Recommendation/Assessment: All further Speech Lanaguage Pathology  needs can be addressed in the next venue of care SLP Visit Diagnosis: Cognitive communication deficit (R41.841)    Follow Up Recommendations  Outpatient SLP;Home health SLP    Frequency and Duration           SLP Evaluation Cognition  Overall Cognitive Status: History of cognitive impairments - at baseline Arousal/Alertness: Awake/alert Orientation Level: Oriented X4 Attention: Focused;Sustained Focused Attention: Appears intact Sustained Attention: Appears intact Memory: Impaired Memory Impairment: Decreased short term memory Decreased Short Term Memory: Verbal complex;Functional complex Awareness: Appears intact Problem Solving: Impaired Problem Solving Impairment: Verbal complex;Functional complex Executive Function: Sequencing;Decision Making Sequencing: Impaired Sequencing Impairment: Verbal complex;Functional complex Decision Making: Impaired Decision Making Impairment: Verbal complex;Functional complex Safety/Judgment: Impaired Comments: Impaired safety  awareness; attempting to get out of bed without assistance       Comprehension   Auditory Comprehension Overall Auditory Comprehension: Appears within functional limits for tasks assessed Visual Recognition/Discrimination Discrimination: Within Function Limits Reading Comprehension Reading Status: Within funtional limits    Expression Expression Primary Mode of Expression: Verbal Verbal Expression Overall Verbal Expression: Impaired Initiation: No impairment Automatic Speech: Name;Social Response Level of Generative/Spontaneous Verbalization: Conversation Repetition: No impairment Naming: Impairment Other Naming Comments: several instances of anomia in conversation Verbal Errors: Aware of errors Pragmatics: No impairment Effective Techniques: Open ended questions Non-Verbal Means of Communication: Not applicable Written Expression Dominant Hand: Right Written Expression: Within Functional Limits   Oral / Motor  Oral Motor/Sensory Function Overall Oral Motor/Sensory Function: Mild impairment Facial ROM: Within Functional Limits Facial Symmetry: Within Functional Limits Facial Strength: Within Functional Limits Facial Sensation: Reduced left Lingual ROM: Within Functional Limits Lingual Symmetry: Within Functional Limits Lingual Strength: Within Functional Limits Lingual Sensation: Within Functional Limits Velum: Within Functional Limits Mandible: Within Functional Limits Motor Speech Overall Motor Speech: Impaired at baseline Respiration: Within functional limits Phonation: Normal Resonance: Within functional limits Articulation: Impaired Intelligibility: Intelligible Motor Planning: Witnin functional limits   [place], [place], [place] Pathologist [phone]  Aliene Altes 03/13/2017, 9:06 AM

## 2017-03-13 NOTE — Consult Note (Signed)
Neurology Consultation Reason for Consult: Left-sided weakness Referring Physician: Darl Householder, D  CC: Left-sided weakness  History is obtained from: Patient  HPI: Laura Pollard is a 76 y.o. female who was in her normal state of health earlier today when she had acute onset of left-sided weakness and numbness started this evening.  She states that started sometime after 8 mother she is not sure when but it might have been even as early as 8:15 or 8:30.   LKW: 8 AM tpa given?: no, outside of window    ROS: A 14 point ROS was performed and is negative except as noted in the HPI.  Past Medical History:  Diagnosis Date  . Abnormal heart rhythm   . Anxiety   . Arthritis   . Atypical chest pain    normal ETT/Echo in 2003.. card cath normal 2010  . Concussion with loss of consciousness 04/27/2016  . Depression   . 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 Headache   . Hypercholesteremia   . Hyperlipidemia   . Liver disease   . Memory difficulties 02/05/2015  . Multiple allergies   . MVP (mitral valve prolapse)   . Nocturnal leg cramps 09/14/2014  . 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 Scoliosis      Family History  Problem Relation Age of Onset  . Heart disease Mother   . CAD Mother   . AAA (abdominal aortic aneurysm) Father   . Bladder Cancer Father   . Kidney cancer Sister        metastasis to lung  . HIV Son      Social History:  reports that she quit smoking about 50 years ago. She has never used smokeless tobacco. She reports that she drinks alcohol. She reports that she does not use drugs.   Exam: Current vital signs: BP (!) 175/82   Pulse 61   Temp (!) 97.5 F (36.4 C) (Oral)   Resp 18   Ht 5\' 8"  (1.727 m)   Wt 68 kg (150 lb)   SpO2 98%   BMI 22.81 kg/m  Vital signs in last 24 hours: Temp:  [97.5 F (36.4 C)] 97.5 F (36.4 C) (11/03 0022) Pulse Rate:  [60-64] 61 (11/03 0207) Resp:  [18] 18 (11/03 0207) BP: (147-175)/(81-82) 175/82 (11/03 0207) SpO2:  [95 %-99 %]  98 % (11/03 0207) Weight:  [68 kg (150 lb)] 68 kg (150 lb) (11/03 0122)   Physical Exam  Constitutional: Appears well-developed and well-nourished.  Psych: Affect appropriate to situation Eyes: No scleral injection HENT: No OP obstrucion Head: Normocephalic.  Cardiovascular: Normal rate and regular rhythm.  Respiratory: Effort normal and breath sounds normal to anterior ascultation GI: Soft.  No distension. There is no tenderness.  Skin: WDI  Neuro: Mental Status: Patient is awake, alert, oriented to person, place, month, year, and situation. Patient is able to give a clear and coherent history. No signs of aphasia or neglect Cranial Nerves: II: Visual Fields are full. Pupils are equal, round, and reactive to light.   III,IV, VI: EOMI without ptosis or diploplia.  V: Facial sensation is decreased on the left VII: Facial movement is very mildly decreased on the left VIII: hearing is intact to voice X: Uvula elevates symmetrically XI: Shoulder shrug is symmetric. XII: tongue is midline without atrophy or fasciculations.  Motor: Tone is normal. Bulk is normal. 5/5 strength was present on the right, she has mild left leg greater than sign left arm weakness with mild drift in the leg  but not the arm  sensory: Sensation is diminished in the left side Deep Tendon Reflexes: 2+ and symmetric in the biceps and patellae.  Cerebellar: FNF and HKS are intact bilaterally  I have reviewed labs in epic and the results pertinent to this consultation are: CMP-unremarkable  I have reviewed the images obtained: CT head-unremarkable  Impression: 76 year old female with likely small ischemic infarct.  She will need to be admitted for further workup and  Therapy.  Recommendations: 1. HgbA1c, fasting lipid panel 2. MRI, MRA  of the brain without contrast 3. Frequent neuro checks 4. Echocardiogram 5. Carotid dopplers 6. Prophylactic therapy-Antiplatelet med: Aspirin - dose 325mg  PO or  300mg  PR 7. Risk factor modification 8. Telemetry monitoring 9. PT consult, OT consult, Speech consult 10. please page stroke NP  Or  PA  Or MD  from 8am -4 pm as this patient will be followed by the stroke team at this point.   You can look them up on [email or web address]      Roland Rack, MD [facility] [phone]  If 7pm- 7am, please page neurology on call as listed in [place].

## 2017-03-13 NOTE — Discharge Summary (Signed)
Physician Discharge Summary  Laura Pollard OZD:[id number] DOB: May 06, 1941 DOA: 03/13/2017  PCP: Patient, No Pcp Per  Admit date: 03/13/2017 Discharge date: 03/13/2017  Admitted From: home Disposition:  home  Recommendations for Outpatient Follow-up:  1. Follow up with Dr. Redmond Pulling in 2-3 weeks  Home Health: none Equipment/Devices: none  Discharge Condition: stable CODE STATUS: Full code Diet recommendation: heart healthy  HPI: Per Dr. Alcario Drought, Laura Pollard is a 76 y.o. female with medical history significant of HLD, dolichoectatic calcified RIGHT vertebral artery.  Patient presents to the ED with c/o acute onset of L sided weakness and numbness.  Symptoms onset sometime last evening around 8pm.  Persistent since onset.  Nothing makes better or worse.  Associated feeling of choking with food.  Patient presents to ED.  Hospital Course: Discharge Diagnoses:  Active Problems:   Anxiety   Trigeminal neuralgia   Vertebral artery aneurysm (HCC)   TIA (transient ischemic attack)   TIA -patient was admitted to the hospital with concern for stroke.  Neurology was consulted and have follow patient while hospitalized.  She underwent the regular stroke workup with MRI/MRA which was negative for acute CVA.  He did show her known stable fusiform right vertebral artery aneurysm with similar mass-effect on pontomedullary junction.  She also underwent a CT angiogram of the neck and head without acute findings (full read below).  Hemoglobin A1c was checked and it was 5.5.  Lipid panel showed an elevated LDL to 113, she is intolerant for statins, continue Zetia on discharge.  She was started on aspirin 81 mg which is to be continued on discharge.  She underwent a 2D echo which showed normal EF of 25-95%, grade 1 diastolic dysfunction. Vertebral artery aneurysm -stable on imaging when compared to prior radiologic studies, follow-up as an outpatient with Dr. Jannifer Franklin Left-sided facial numbness with  hypersensitivity -in the V2 and V3 area, unclear etiology, per neurology? Anxiety versus trigeminal neuralgia.  Started on gabapentin, prescription provided on discharge. Anxiety -resume home medications   Discharge Instructions  Discharge Instructions    Ambulatory referral to Neurology    Complete by:  As directed    Pt will follow up with Dr. Jannifer Franklin at [facility] in about 4-6 weeks. Thanks.     Allergies as of 03/13/2017      Reactions   Indocin [indomethacin] Other (See Comments)   Nausea and vomiting   Keflex [cephalexin] Other (See Comments)   unknown reaction   Lipitor [atorvastatin] Other (See Comments)   Muscle aches   Pravastatin Other (See Comments)   Muscle aches   Tape Rash   PAPER TAPE --- RASH      Medication List    TAKE these medications   ALPRAZolam 0.25 MG tablet Commonly known as:  XANAX Take 0.25 mg by mouth at bedtime as needed for anxiety.   aspirin 81 MG EC tablet Take 1 tablet (81 mg total) by mouth daily.   Calcium-Vitamin D-Vitamin K 500-100-40 MG-UNT-MCG Chew Chew 1 tablet by mouth daily.   cholecalciferol 1000 units tablet Commonly known as:  VITAMIN D Take 2,000 Units by mouth daily.   CO Q 10 PO Take 1 capsule by mouth daily.   Cyanocobalamin 2500 MCG Tabs Take 1 tablet by mouth daily.   ezetimibe 10 MG tablet Commonly known as:  ZETIA Take 10 mg by mouth daily.   FLUoxetine 40 MG capsule Commonly known as:  PROZAC Take 40 mg by mouth daily.   gabapentin 100 MG capsule  Commonly known as:  NEURONTIN Take 1 capsule (100 mg total) by mouth 2 (two) times daily.   Glucosamine HCl 1500 MG Tabs Take 1 tablet by mouth 2 (two) times daily.   Omega 3 1200 MG Caps Take 1 capsule by mouth daily.   rivastigmine 4.6 mg/24hr Commonly known as:  EXELON PLACE 1 PATCH ONTO THE SKIN EVERY DAY   Turmeric Curcumin 500 MG Caps Take 1 capsule by mouth daily.      Follow-up Information    Kathrynn Ducking, MD. Schedule an appointment as  soon as possible for a visit in 6 week(s).   Specialty:  Neurology Contact information: [address]  [place] [address] [phone]           Consultations:  Neurology  Procedures/Studies:  2D echo  Study Conclusions  - Left ventricle: The cavity size was normal. There was mild concentric hypertrophy. Systolic function was normal. The estimated ejection fraction was in the range of 60% to 65%. Wall motion was normal; there were no regional wall motion abnormalities. Doppler parameters are consistent with abnormal left ventricular relaxation (grade 1 diastolic dysfunction). There was no evidence of elevated ventricular filling pressure by Doppler parameters. - Aortic valve: There was no regurgitation. - Aortic root: The aortic root was normal in size. - Mitral valve: There was trivial regurgitation. - Left atrium: The atrium was normal in size. - Right ventricle: Systolic function was normal. - Right atrium: The atrium was normal in size. - Tricuspid valve: There was trivial regurgitation. - Pulmonary arteries: Systolic pressure was within the normal range. - Inferior vena cava: The vessel was normal in size. - Pericardium, extracardiac: There was no pericardial effusion.  Ct Angio Head W Or Wo Contrast  Result Date: 03/13/2017 CLINICAL DATA:  76 year old female with left facial numbness and confusion. 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 EXAM: CT ANGIOGRAPHY HEAD AND NECK TECHNIQUE: Multidetector CT imaging of the head and neck was performed using the standard protocol during bolus administration of intravenous contrast. Multiplanar CT image reconstructions and MIPs were obtained to evaluate the vascular anatomy. Carotid stenosis measurements (when applicable) are obtained utilizing NASCET criteria, using the distal internal carotid diameter as the denominator. CONTRAST:  50 mL Isovue 370  COMPARISON:  Brain MRI and intracranial MRA 0446 hours today. Head CTs today and earlier. FINDINGS: CTA NECK Skeleton: No acute osseous abnormality identified. Cervical spine degeneration. Osteopenia. Upper chest: Mild dependent atelectasis. No superior mediastinal lymphadenopathy. Other neck: Negative.  No cervical lymphadenopathy. Aortic arch: 3 vessel arch configuration with mild arch atherosclerosis. Right carotid system: No brachiocephalic artery or right CCA origin stenosis. Negative right cervical carotid arteries aside from mild tortuosity. Left carotid system: Negative left CCA origin. Mild left CCA tortuosity. Mild calcified plaque at the left carotid bifurcation. No stenosis. Vertebral arteries: No proximal right subclavian artery stenosis despite mild plaque. Normal right vertebral artery origin. Dominant and dolichoectatic right vertebral throughout its course to the skullbase. No stenosis. No proximal left subclavian artery stenosis, minimal plaque. Non dominant and diminutive left vertebral artery origin may be mildly to moderately stenotic. The left vertebral remains diminutive but is patent to the skullbase. CTA HEAD Posterior circulation: Calcified dolichoectatic right vertebral artery V4 segment measures up to 6-7 mm diameter. No stenosis. Tortuous vertebrobasilar junction. The diminutive non dominant left vertebral artery functionally terminates in PICA. Mildly ectatic basilar artery with no atherosclerosis or stenosis. Normal SCA and PCA origins.  Posterior communicating arteries are diminutive or absent. Normal bilateral PCA branches. Anterior circulation: Patent ICA siphons and carotid termini. Mild for age siphon calcified plaque with no stenosis. A tiny 2 mm cephalad directed saccular lesion of the supraclinoid right siphon is re- demonstrated (series 10, image 98 and series 8, image 113, and seems to be separate from the right ophthalmic artery, although that artery does appear to have a  late origin. Normal MCA and ACA origins. Mildly tortuous ACA is. ACA branches otherwise within normal limits. Left MCA M1 segment, bifurcation, and left MCA branches are within normal limits. Right MCA M1 segment, trifurcation, and right MCA branches are within normal limits. Venous sinuses: Patent on delayed images. Anatomic variants: Dominant, dolichoectatic right vertebral artery. Delayed phase: Stable gray-white matter differentiation throughout the brain. No abnormal enhancement identified. Review of the MIP images confirms the above findings IMPRESSION: 1. Negative for emergent large vessel occlusion, and no arterial stenosis in the head or neck. 2. The right vertebral artery is dominant and dolichoectatic throughout its course. The right V4 segment is calcified with fusiform enlargement up to 7 mm and severe tortuosity, but there is no stenosis or saccular vertebral aneurysm. 3. 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 4.  Stable CT appearance of the brain. Electronically Signed   By: Genevie Ann M.D.   On: 03/13/2017 10:09   Dg Chest 2 View  Result Date: 03/13/2017 CLINICAL DATA:  76 year old female with numbness. EXAM: CHEST  2 VIEW COMPARISON:  Chest radiograph dated 03/04/2010 FINDINGS: Stable borderline cardiomegaly. There is no focal consolidation, pleural effusion, or pneumothorax. There is osteopenia with degenerative changes of the spine. No acute osseous pathology. IMPRESSION: No acute cardiopulmonary process. Stable mild cardiomegaly. Electronically Signed   By: Anner Crete M.D.   On: 03/13/2017 01:34   Ct Head Wo Contrast  Result Date: 03/13/2017 CLINICAL DATA:  LEFT facial numbness beginning at 11 p.m., feeling unwell today. Confusion. History of memory difficulties, post concussion vertigo, hypercholesterolemia. EXAM: CT HEAD WITHOUT CONTRAST TECHNIQUE: Contiguous axial images were obtained from the base of the skull through the vertex without  intravenous contrast. COMPARISON:  CT HEAD May 06, 2016 FINDINGS: BRAIN: No intraparenchymal hemorrhage, mass effect nor midline shift. The ventricles and sulci are normal for age. Minimal supratentorial white matter hypodensities less than expected for patient's age, though non-specific are most compatible with chronic small vessel ischemic disease. No acute large vascular territory infarcts. No abnormal extra-axial fluid collections. Basal cisterns are patent. VASCULAR: Mild calcific atherosclerosis of the carotid siphons. Tortuous dolichoectatic calcified RIGHT vertebral artery. SKULL: No skull fracture. No significant scalp soft tissue swelling. SINUSES/ORBITS: The mastoid air-cells and included paranasal sinuses are well-aerated.The included ocular globes and orbital contents are non-suspicious. Status post bilateral ocular lens implants. OTHER: None. IMPRESSION: 1. No acute intracranial process. 2. Re- demonstration of dolichoectatic calcified RIGHT vertebral artery seen with chronic hypertension. 3. Otherwise negative noncontrast CT HEAD for age. 4. Critical Value/emergent results were called by telephone at the time of interpretation on 03/13/2017 at 2:12 am to Dr. Shirlyn Goltz , who verbally acknowledged these results. Electronically Signed   By: Elon Alas M.D.   On: 03/13/2017 02:12   Ct Angio Neck W Or Wo Contrast  Result Date: 03/13/2017 CLINICAL DATA:  76 year old female with left facial numbness and confusion. 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 EXAM: CT ANGIOGRAPHY HEAD AND NECK TECHNIQUE: Multidetector CT imaging of the  head and neck was performed using the standard protocol during bolus administration of intravenous contrast. Multiplanar CT image reconstructions and MIPs were obtained to evaluate the vascular anatomy. Carotid stenosis measurements (when applicable) are obtained utilizing NASCET criteria, using the  distal internal carotid diameter as the denominator. CONTRAST:  50 mL Isovue 370 COMPARISON:  Brain MRI and intracranial MRA 0446 hours today. Head CTs today and earlier. FINDINGS: CTA NECK Skeleton: No acute osseous abnormality identified. Cervical spine degeneration. Osteopenia. Upper chest: Mild dependent atelectasis. No superior mediastinal lymphadenopathy. Other neck: Negative.  No cervical lymphadenopathy. Aortic arch: 3 vessel arch configuration with mild arch atherosclerosis. Right carotid system: No brachiocephalic artery or right CCA origin stenosis. Negative right cervical carotid arteries aside from mild tortuosity. Left carotid system: Negative left CCA origin. Mild left CCA tortuosity. Mild calcified plaque at the left carotid bifurcation. No stenosis. Vertebral arteries: No proximal right subclavian artery stenosis despite mild plaque. Normal right vertebral artery origin. Dominant and dolichoectatic right vertebral throughout its course to the skullbase. No stenosis. No proximal left subclavian artery stenosis, minimal plaque. Non dominant and diminutive left vertebral artery origin may be mildly to moderately stenotic. The left vertebral remains diminutive but is patent to the skullbase. CTA HEAD Posterior circulation: Calcified dolichoectatic right vertebral artery V4 segment measures up to 6-7 mm diameter. No stenosis. Tortuous vertebrobasilar junction. The diminutive non dominant left vertebral artery functionally terminates in PICA. Mildly ectatic basilar artery with no atherosclerosis or stenosis. Normal SCA and PCA origins. Posterior communicating arteries are diminutive or absent. Normal bilateral PCA branches. Anterior circulation: Patent ICA siphons and carotid termini. Mild for age siphon calcified plaque with no stenosis. A tiny 2 mm cephalad directed saccular lesion of the supraclinoid right siphon is re- demonstrated (series 10, image 98 and series 8, image 113, and seems to be separate  from the right ophthalmic artery, although that artery does appear to have a late origin. Normal MCA and ACA origins. Mildly tortuous ACA is. ACA branches otherwise within normal limits. Left MCA M1 segment, bifurcation, and left MCA branches are within normal limits. Right MCA M1 segment, trifurcation, and right MCA branches are within normal limits. Venous sinuses: Patent on delayed images. Anatomic variants: Dominant, dolichoectatic right vertebral artery. Delayed phase: Stable gray-white matter differentiation throughout the brain. No abnormal enhancement identified. Review of the MIP images confirms the above findings IMPRESSION: 1. Negative for emergent large vessel occlusion, and no arterial stenosis in the head or neck. 2. The right vertebral artery is dominant and dolichoectatic throughout its course. The right V4 segment is calcified with fusiform enlargement up to 7 mm and severe tortuosity, but there is no stenosis or saccular vertebral aneurysm. 3. 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 4.  Stable CT appearance of the brain. Electronically Signed   By: Genevie Ann M.D.   On: 03/13/2017 10:09   Mr Angiogram Head Wo Contrast  Result Date: 03/13/2017 CLINICAL DATA:  Suspect stroke. History of hypercholesterolemia, memory difficulties, concussion. EXAM: MRI HEAD WITHOUT CONTRAST MRA HEAD WITHOUT CONTRAST TECHNIQUE: Multiplanar, multiecho pulse sequences of the brain and surrounding structures were obtained without intravenous contrast. Angiographic images of the head were obtained using MRA technique without contrast. COMPARISON:  CT HEAD March 13, 2017 at 0156 hours and MRI of the head Sep 23, 2014 FINDINGS: MRI HEAD FINDINGS BRAIN: No reduced diffusion to suggest acute ischemia. No susceptibility artifact to suggest hemorrhage. Stable moderate parenchymal brain volume loss without hydrocephalus. A few scattered  subcentimeter supratentorial white matter FLAIR T2  hyperintensities compatible with mild chronic small vessel ischemic disease, less than expected for age. No suspicious parenchymal signal, mass or mass effect. No abnormal extra-axial fluid collections. VASCULAR: Patent, see below. Dolichoectatic RIGHT vertebral artery resulting in similar mass effect on pontomedullary junction. SKULL AND UPPER CERVICAL SPINE: No abnormal sellar expansion. No suspicious calvarial bone marrow signal. Craniocervical junction maintained. SINUSES/ORBITS: Small LEFT maxillary mucosal retention cysts without paranasal sinus air-fluid levels. Trace RIGHT mastoid effusion. The included ocular globes and orbital contents are non-suspicious. Status post bilateral ocular lens implants. OTHER: None. MRA HEAD FINDINGS ANTERIOR CIRCULATION: Normal flow related enhancement of the included cervical, petrous, cavernous and supraclinoid internal carotid arteries. 3 mm superiorly directed RIGHT paraophthalmic aneurysm. Low Patent anterior communicating artery. Patent anterior and middle cerebral arteries, including distal segments. No large vessel occlusion, flow limiting stenosis. POSTERIOR CIRCULATION: Dolichoectatic RIGHT vertebral artery measuring 6 mm transaxial dimension. Patent bilateral posterior inferior cerebellar artery's, LEFT vertebral artery likely terminates in the posterior inferior cerebellar artery. Basilar artery is patent, with normal flow related enhancement of the main branch vessels. Patent posterior cerebral arteries, mild luminal irregularity LEFT posterior cerebral artery suggesting asymmetric atherosclerosis. No large vessel occlusion, flow limiting stenosis,  aneurysm. ANATOMIC VARIANTS: None. Source images and MIP images were reviewed. IMPRESSION: MRI HEAD: 1. No acute intracranial process. 2. Stable moderate parenchymal brain volume loss and mild chronic small vessel ischemic disease. MRA HEAD: 1. No emergent large vessel occlusion or flow limiting stenosis. 2. Stable  fusiform RIGHT vertebral artery aneurysm with similar mass effect on pontomedullary junction. 3. 3 mm RIGHT paraophthalmic aneurysm. 4. Neuro-Interventional Radiology consultation is suggested to evaluate the appropriateness of potential treatment. Non-emergent evaluation can be arranged by calling [phone] during usual hours. Electronically Signed   By: Elon Alas M.D.   On: 03/13/2017 05:47   Mr Brain Wo Contrast  Result Date: 03/13/2017 CLINICAL DATA:  Suspect stroke. History of hypercholesterolemia, memory difficulties, concussion. EXAM: MRI HEAD WITHOUT CONTRAST MRA HEAD WITHOUT CONTRAST TECHNIQUE: Multiplanar, multiecho pulse sequences of the brain and surrounding structures were obtained without intravenous contrast. Angiographic images of the head were obtained using MRA technique without contrast. COMPARISON:  CT HEAD March 13, 2017 at 0156 hours and MRI of the head Sep 23, 2014 FINDINGS: MRI HEAD FINDINGS BRAIN: No reduced diffusion to suggest acute ischemia. No susceptibility artifact to suggest hemorrhage. Stable moderate parenchymal brain volume loss without hydrocephalus. A few scattered subcentimeter supratentorial white matter FLAIR T2 hyperintensities compatible with mild chronic small vessel ischemic disease, less than expected for age. No suspicious parenchymal signal, mass or mass effect. No abnormal extra-axial fluid collections. VASCULAR: Patent, see below. Dolichoectatic RIGHT vertebral artery resulting in similar mass effect on pontomedullary junction. SKULL AND UPPER CERVICAL SPINE: No abnormal sellar expansion. No suspicious calvarial bone marrow signal. Craniocervical junction maintained. SINUSES/ORBITS: Small LEFT maxillary mucosal retention cysts without paranasal sinus air-fluid levels. Trace RIGHT mastoid effusion. The included ocular globes and orbital contents are non-suspicious. Status post bilateral ocular lens implants. OTHER: None. MRA HEAD FINDINGS ANTERIOR  CIRCULATION: Normal flow related enhancement of the included cervical, petrous, cavernous and supraclinoid internal carotid arteries. 3 mm superiorly directed RIGHT paraophthalmic aneurysm. Low Patent anterior communicating artery. Patent anterior and middle cerebral arteries, including distal segments. No large vessel occlusion, flow limiting stenosis. POSTERIOR CIRCULATION: Dolichoectatic RIGHT vertebral artery measuring 6 mm transaxial dimension. Patent bilateral posterior inferior cerebellar artery's, LEFT vertebral artery likely terminates in the posterior inferior cerebellar artery. Basilar  artery is patent, with normal flow related enhancement of the main branch vessels. Patent posterior cerebral arteries, mild luminal irregularity LEFT posterior cerebral artery suggesting asymmetric atherosclerosis. No large vessel occlusion, flow limiting stenosis,  aneurysm. ANATOMIC VARIANTS: None. Source images and MIP images were reviewed. IMPRESSION: MRI HEAD: 1. No acute intracranial process. 2. Stable moderate parenchymal brain volume loss and mild chronic small vessel ischemic disease. MRA HEAD: 1. No emergent large vessel occlusion or flow limiting stenosis. 2. 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 4. Neuro-Interventional Radiology consultation is suggested to evaluate the appropriateness of potential treatment. Non-emergent evaluation can be arranged by calling [phone] during usual hours. Electronically Signed   By: Elon Alas M.D.   On: 03/13/2017 05:47     Subjective: - no chest pain, shortness of breath, no abdominal pain, nausea or vomiting.   Discharge Exam: Vitals:   03/13/17 0857 03/13/17 1323  BP: 138/61 (!) 152/80  Pulse: (!) 58 70  Resp: 20 16  Temp: 97.7 F (36.5 C) 97.8 F (36.6 C)  SpO2: 94% 91%    General: Pt is alert, awake, not in acute distress Cardiovascular: RRR,  S1/S2 +, no rubs, no gallops Respiratory: CTA bilaterally, no wheezing, no rhonchi Abdominal: Soft, NT, ND, bowel sounds +   The results of significant diagnostics from this hospitalization (including imaging, microbiology, ancillary and laboratory) are listed below for reference.     Microbiology: No results found for this or any previous visit (from the past 240 hour(s)).   Labs: BNP (last 3 results) No results for input(s): BNP in the last 8760 hours. Basic Metabolic Panel:  Recent Labs Lab 03/13/17 0030  NA 140  K 4.1  CL 106  CO2 25  GLUCOSE 104*  BUN 20  CREATININE 0.52  CALCIUM 9.8   Liver Function Tests:  Recent Labs Lab 03/13/17 0030  AST 32  ALT 41  ALKPHOS 65  BILITOT 0.8  PROT 7.4  ALBUMIN 4.4   No results for input(s): LIPASE, AMYLASE in the last 168 hours. No results for input(s): AMMONIA in the last 168 hours. CBC:  Recent Labs Lab 03/13/17 0030  WBC 6.8  HGB 13.5  HCT 39.9  MCV 88.7  PLT 166   Cardiac Enzymes: No results for input(s): CKTOTAL, CKMB, CKMBINDEX, TROPONINI in the last 168 hours. BNP: Invalid input(s): POCBNP CBG: No results for input(s): GLUCAP in the last 168 hours. D-Dimer No results for input(s): DDIMER in the last 72 hours. Hgb A1c  Recent Labs  03/13/17 0411  HGBA1C 5.5   Lipid Profile  Recent Labs  03/13/17 0411  CHOL 185  HDL 44  LDLCALC 113*  TRIG 142  CHOLHDL 4.2   Thyroid function studies No results for input(s): TSH, T4TOTAL, T3FREE, THYROIDAB in the last 72 hours.  Invalid input(s): FREET3 Anemia work up No results for input(s): VITAMINB12, FOLATE, FERRITIN, TIBC, IRON, RETICCTPCT in the last 72 hours. Urinalysis No results found for: COLORURINE, APPEARANCEUR, LABSPEC, [place], GLUCOSEU, HGBUR, BILIRUBINUR, KETONESUR, PROTEINUR, UROBILINOGEN, NITRITE, LEUKOCYTESUR Sepsis Labs Invalid input(s): PROCALCITONIN,  WBC,  LACTICIDVEN   Time coordinating discharge: 20  minutes  SIGNED:  Marzetta Board, MD  [facility] 03/13/2017, 5:00 PM Pager [phone]  If 7PM-7AM, please contact night-coverage [email or web address] Password [email or web address]

## 2017-03-13 NOTE — ED Notes (Signed)
[facility] here to transport pt to [facility]. 

## 2017-03-13 NOTE — H&P (Signed)
History and Physical    ANIVEA VELASQUES OZD:[id number] DOB: 10/16/1940 DOA: 03/13/2017  PCP: Patient, No Pcp Per  Patient coming from: Home  I have personally briefly reviewed patient's old medical records in [place]  Chief Complaint: L sided weakness  HPI: Laquilla A Dory is a 76 y.o. female with medical history significant of HLD, dolichoectatic calcified RIGHT vertebral artery.  Patient presents to the ED with c/o acute onset of L sided weakness and numbness.  Symptoms onset sometime last evening around 8pm.  Persistent since onset.  Nothing makes better or worse.  Associated feeling of choking with food.  Patient presents to ED.   ED Course: Concern for sub-acute stroke.  Admission and work up requested by neurology.   Review of Systems: As per HPI otherwise 10 point review of systems negative.   Past Medical History:  Diagnosis Date  . Abnormal heart rhythm   . Anxiety   . Arthritis   . Atypical chest pain    normal ETT/Echo in 2003.. card cath normal 2010  . Concussion with loss of consciousness 04/27/2016  . Depression   . 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 Headache   . Hypercholesteremia   . Hyperlipidemia   . Liver disease   . Memory difficulties 02/05/2015  . Multiple allergies   . MVP (mitral valve prolapse)   . Nocturnal leg cramps 09/14/2014  . 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 Scoliosis     Past Surgical History:  Procedure Laterality Date  . CARDIAC CATHETERIZATION  2010   normal  . cataract surgery    . CESAREAN SECTION    . COLONOSCOPY    . ETT  2003   also ECHO..normal  . OVARIAN CYST SURGERY    . RETINAL DETACHMENT SURGERY Right 10 years ago  . TONSILLECTOMY       reports that she quit smoking about 50 years ago. She has never used smokeless tobacco. She reports that she drinks alcohol. She reports that she does not use drugs.  Allergies  Allergen Reactions  . Indocin [Indomethacin] Other (See Comments)    Nausea and vomiting   .  Keflex [Cephalexin] Other (See Comments)    unknown reaction   . Lipitor [Atorvastatin] Other (See Comments)    Muscle aches   . Pravastatin Other (See Comments)    Muscle aches   . Tape Rash    PAPER TAPE --- RASH    Family History  Problem Relation Age of Onset  . Heart disease Mother   . CAD Mother   . AAA (abdominal aortic aneurysm) Father   . Bladder Cancer Father   . Kidney cancer Sister        metastasis to lung  . HIV Son      Prior to Admission medications   Medication Sig Start Date End Date Taking? Authorizing Provider  ALPRAZolam [name]) 0.25 MG tablet Take 0.25 mg by mouth at bedtime as needed for anxiety.   Yes [provider]  Calcium-Vitamin D-Vitamin K 500-100-40 MG-UNT-MCG CHEW Chew 1 tablet by mouth daily.   Yes [provider]  cholecalciferol (VITAMIN D) 1000 UNITS tablet Take 2,000 Units by mouth daily.   Yes [provider]  Coenzyme Q10 (CO Q 10 PO) Take 1 capsule by mouth daily.   Yes [provider]  Cyanocobalamin 2500 MCG TABS Take 1 tablet by mouth daily.   Yes [provider]  ezetimibe (ZETIA) 10 MG tablet Take 10 mg by mouth daily.  Yes [provider]  FLUoxetine (PROZAC) 40 MG capsule Take 40 mg by mouth daily.   Yes [provider]  Glucosamine HCl 1500 MG TABS Take 1 tablet by mouth 2 (two) times daily.   Yes [provider]  Omega 3 1200 MG CAPS Take 1 capsule by mouth daily.   Yes [provider]  rivastigmine (EXELON) 4.6 mg/24hr PLACE 1 PATCH ONTO THE SKIN EVERY DAY 02/08/17  Yes Kathrynn Ducking, MD  Turmeric Curcumin 500 MG CAPS Take 1 capsule by mouth daily.   Yes [provider]    Physical Exam: Vitals:   03/13/17 0207 03/13/17 0230 03/13/17 0245 03/13/17 0300  BP: (!) 175/82 (!) 164/90 (!) 153/85 (!) 148/72  Pulse: 61 62 (!) 57 (!) 56  Resp: 18 18 20  (!) 21  Temp:      TempSrc:      SpO2: 98% 100% 99% 98%  Weight:      Height:          Constitutional: NAD, calm, comfortable Eyes: PERRL, lids and conjunctivae normal ENMT: Mucous membranes are moist. Posterior pharynx clear of any exudate or lesions.Normal dentition.  Neck: normal, supple, no masses, no thyromegaly Respiratory: clear to auscultation bilaterally, no wheezing, no crackles. Normal respiratory effort. No accessory muscle use.  Cardiovascular: Regular rate and rhythm, no murmurs / rubs / gallops. No extremity edema. 2+ pedal pulses. No carotid bruits.  Abdomen: no tenderness, no masses palpated. No hepatosplenomegaly. Bowel sounds positive.  Musculoskeletal: no clubbing / cyanosis. No joint deformity upper and lower extremities. Good ROM, no contractures. Normal muscle tone.  Skin: no rashes, lesions, ulcers. No induration Neurologic: CN 2-12 grossly intact. Sensation intact, DTR normal. Strength 5/5 in all 4.  Psychiatric: Normal judgment and insight. Alert and oriented x 3. Normal mood.    Labs on Admission: I have personally reviewed following labs and imaging studies  CBC:  Recent Labs Lab 03/13/17 0030  WBC 6.8  HGB 13.5  HCT 39.9  MCV 88.7  PLT 497   Basic Metabolic Panel:  Recent Labs Lab 03/13/17 0030  NA 140  K 4.1  CL 106  CO2 25  GLUCOSE 104*  BUN 20  CREATININE 0.52  CALCIUM 9.8   GFR: Estimated Creatinine Clearance: 61.3 mL/min (by C-G formula based on SCr of 0.52 mg/dL). Liver Function Tests:  Recent Labs Lab 03/13/17 0030  AST 32  ALT 41  ALKPHOS 65  BILITOT 0.8  PROT 7.4  ALBUMIN 4.4   No results for input(s): LIPASE, AMYLASE in the last 168 hours. No results for input(s): AMMONIA in the last 168 hours. Coagulation Profile: No results for input(s): INR, PROTIME in the last 168 hours. Cardiac Enzymes: No results for input(s): CKTOTAL, CKMB, CKMBINDEX, TROPONINI in the last 168 hours. BNP (last 3 results) No results for input(s): PROBNP in the last 8760 hours. HbA1C: No results for input(s): HGBA1C in the  last 72 hours. CBG: No results for input(s): GLUCAP in the last 168 hours. Lipid Profile: No results for input(s): CHOL, HDL, LDLCALC, TRIG, CHOLHDL, LDLDIRECT in the last 72 hours. Thyroid Function Tests: No results for input(s): TSH, T4TOTAL, FREET4, T3FREE, THYROIDAB in the last 72 hours. Anemia Panel: No results for input(s): VITAMINB12, FOLATE, FERRITIN, TIBC, IRON, RETICCTPCT in the last 72 hours. Urine analysis: No results found for: COLORURINE, APPEARANCEUR, LABSPEC, PHURINE, GLUCOSEU, HGBUR, BILIRUBINUR, KETONESUR, PROTEINUR, UROBILINOGEN, NITRITE, LEUKOCYTESUR  Radiological Exams on Admission: Dg Chest 2 View  Result Date: 03/13/2017 CLINICAL DATA:  76 year old female with numbness. EXAM: CHEST  2 VIEW COMPARISON:  Chest radiograph dated 03/04/2010 FINDINGS: Stable borderline cardiomegaly. There is no focal consolidation, pleural effusion, or pneumothorax. There is osteopenia with degenerative changes of the spine. No acute osseous pathology. IMPRESSION: No acute cardiopulmonary process. Stable mild cardiomegaly. Electronically Signed   By: Anner Crete M.D.   On: 03/13/2017 01:34   Ct Head Wo Contrast  Result Date: 03/13/2017 CLINICAL DATA:  LEFT facial numbness beginning at 11 p.m., feeling unwell today. Confusion. History of memory difficulties, post concussion vertigo, hypercholesterolemia. EXAM: CT HEAD WITHOUT CONTRAST TECHNIQUE: Contiguous axial images were obtained from the base of the skull through the vertex without intravenous contrast. COMPARISON:  CT HEAD May 06, 2016 FINDINGS: BRAIN: No intraparenchymal hemorrhage, mass effect nor midline shift. The ventricles and sulci are normal for age. Minimal supratentorial white matter hypodensities less than expected for patient's age, though non-specific are most compatible with chronic small vessel ischemic disease. No acute large vascular territory infarcts. No abnormal extra-axial fluid collections. Basal cisterns are  patent. VASCULAR: Mild calcific atherosclerosis of the carotid siphons. Tortuous dolichoectatic calcified RIGHT vertebral artery. SKULL: No skull fracture. No significant scalp soft tissue swelling. SINUSES/ORBITS: The mastoid air-cells and included paranasal sinuses are well-aerated.The included ocular globes and orbital contents are non-suspicious. Status post bilateral ocular lens implants. OTHER: None. IMPRESSION: 1. No acute intracranial process. 2. Re- demonstration of dolichoectatic calcified RIGHT vertebral artery seen with chronic hypertension. 3. Otherwise negative noncontrast CT HEAD for age. 4. Critical Value/emergent results were called by telephone at the time of interpretation on 03/13/2017 at 2:12 am to Dr. Shirlyn Goltz , who verbally acknowledged these results. Electronically Signed   By: Elon Alas M.D.   On: 03/13/2017 02:12    EKG: Independently reviewed.  Assessment/Plan Active Problems:   Acute ischemic stroke ([place])    1. Acute ischemic stroke - 1. Stroke pathway 2. MRI/MRA 3. 2d echo 4. Carotid dopplers 5. A1C, lipid profile 6. PT/OT/SLP 7. ASA 325 8. Tele monitor  DVT prophylaxis: Lovenox Code Status: Full Family Communication: No family in room Disposition Plan: Home after admit Consults called: Neuro Admission status: Admit to inpatient   Etta Quill DO [facility] Pager [phone]  If 7AM-7PM, please contact day team taking care of patient [email or web address] Password [email or web address]  03/13/2017, 4:05 AM

## 2017-03-13 NOTE — Progress Notes (Signed)
  Echocardiogram 2D Echocardiogram has been performed.  Laura Pollard 03/13/2017, 3:47 PM

## 2017-03-13 NOTE — Progress Notes (Signed)
STROKE TEAM PROGRESS NOTE   HISTORY OF PRESENT ILLNESS (per record) Laura Pollard is a 76 y.o. female who was in her normal state of health earlier today when she had acute onset of left-sided weakness and numbness started this evening.  She states that started sometime after 8 mother she is not sure when but it might have been even as early as 8:15 or 8:30.   LKW: 8 AM tpa given?: no, outside of window  SUBJECTIVE (INTERVAL HISTORY) Her husband is at the bedside.  Pt stated that she was working at [employer] yesterday and had fogginess on the head with confusion with computer questions. 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 She was not happy and went home for rest. Then she was at home later the day, feeling left face numbness as well as mild numbness at left arm and left leg, some difficulty with swallowing.  She came to our ED for evaluation.  Currently, her left arm and left leg numbness much better but still has subtle subjective numbness, however, she still has significant left facial hypersensitivity on touch, which has not been resolved.  She had some dermatological procedure last week at bilateral frontal and left cheek area, which she still has a little bit soreness at the spots.   OBJECTIVE Temp:  [97.5 F (36.4 C)-97.9 F (36.6 C)] 97.9 F (36.6 C) (11/03 0530) Pulse Rate:  [56-64] 58 (11/03 0530) Resp:  [18-26] 18 (11/03 0530) BP: (139-175)/(58-90) 139/64 (11/03 0530) SpO2:  [95 %-100 %] 97 % (11/03 0530) Weight:  [150 lb (68 kg)] 150 lb (68 kg) (11/03 0122)  CBC:  Recent Labs Lab 03/13/17 0030  WBC 6.8  HGB 13.5  HCT 39.9  MCV 88.7  PLT 621    Basic Metabolic Panel:  Recent Labs Lab 03/13/17 0030  NA 140  K 4.1  CL 106  CO2 25  GLUCOSE 104*  BUN 20  CREATININE 0.52  CALCIUM 9.8    Lipid Panel:    Component Value Date/Time   CHOL 185 03/13/2017 0411   TRIG 142 03/13/2017  0411   HDL 44 03/13/2017 0411   CHOLHDL 4.2 03/13/2017 0411   VLDL 28 03/13/2017 0411   LDLCALC 113 (H) 03/13/2017 0411   HgbA1c:  Lab Results  Component Value Date   HGBA1C 5.5 03/13/2017   Urine Drug Screen: No results found for: LABOPIA, COCAINSCRNUR, LABBENZ, AMPHETMU, THCU, LABBARB  Alcohol Level No results found for: [place] I have personally reviewed the radiological images below and agree with the radiology interpretations.  Dg Chest 2 View 03/13/2017 IMPRESSION:  No acute cardiopulmonary process. Stable mild cardiomegaly.   Ct Head Wo Contrast 03/13/2017 IMPRESSION:  1. No acute intracranial process.  2. Re- demonstration of dolichoectatic calcified RIGHT vertebral artery seen with chronic hypertension.  3. Otherwise negative noncontrast CT HEAD for age.   Mr Angiogram Head Wo Contrast 03/13/2017 IMPRESSION:  MRI HEAD:  1. No acute intracranial process.  2. Stable moderate parenchymal brain volume loss and mild chronic small vessel ischemic disease.   MRA HEAD:  1. No emergent large vessel occlusion or flow limiting stenosis.  2. Stable fusiform RIGHT vertebral artery aneurysm with similar mass effect on pontomedullary junction.  3. 3 mm RIGHT paraophthalmic aneurysm.  4. Neuro-Interventional Radiology consultation is suggested to evaluate the appropriateness of potential treatment.  Ct Angio Head and neck W Or Wo Contrast 03/13/2017  IMPRESSION: 1. Negative for emergent large vessel occlusion, and no arterial stenosis in the head or neck. 2. The right vertebral artery is dominant and dolichoectatic throughout its course. The right V4 segment is calcified with fusiform enlargement up to 7 mm and severe tortuosity, but there is no stenosis or saccular vertebral aneurysm. 3. 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 4.  Stable CT appearance of the brain.   Transthoracic Echocardiogram - pending   PHYSICAL EXAM Vitals:    03/13/17 0400 03/13/17 0415 03/13/17 0530 03/13/17 0857  BP: (!) 164/61 (!) 147/76 139/64 138/61  Pulse: 61 (!) 58 (!) 58 (!) 58  Resp: (!) 23 (!) 25 18 20   Temp:   97.9 F (36.6 C) 97.7 F (36.5 C)  TempSrc:   Oral Oral  SpO2: 95% 97% 97% 94%  Weight:      Height:        Temp:  [97.5 F (36.4 C)-97.9 F (36.6 C)] 97.8 F (36.6 C) (11/03 1323) Pulse Rate:  [56-70] 70 (11/03 1323) Resp:  [16-26] 16 (11/03 1323) BP: (138-175)/(58-90) 152/80 (11/03 1323) SpO2:  [91 %-100 %] 91 % (11/03 1323) Weight:  [150 lb (68 kg)] 150 lb (68 kg) (11/03 0122)  General - Well nourished, well developed, in no apparent distress, however very anxious.  Ophthalmologic - Fundi not visualized due to noncooperation.  Cardiovascular - Regular rate and rhythm.  Mental Status -  Level of arousal and orientation to time, place, and person were intact. Language including expression, naming, repetition, comprehension was assessed and found intact. Attention span and concentration were normal. Fund of Knowledge was assessed and was intact.  Cranial Nerves II - XII - II - Visual field intact OU. III, IV, VI - Extraocular movements intact. V - Facial sensation examination showed hypersensitivity to touch at left facial V2 and V3 area, more significant at V2.  Bilateral V1 hypersensitivity. (Patient had recent dermatological procedure at bifrontal and left cheek area) VII - Facial movement intact bilaterally. VIII - Hearing & vestibular intact bilaterally. X - Palate elevates symmetrically. XI - Chin turning & shoulder shrug intact bilaterally. XII - Tongue protrusion intact.  Motor Strength - The patient's strength was normal in all extremities and pronator drift was absent.  Bulk was normal and fasciculations were absent.   Motor Tone - Muscle tone was assessed at the neck and appendages and was normal.  Reflexes - The patient's reflexes were 1+ in all extremities and she had no pathological  reflexes.  Sensory - Light touch, temperature/pinprick were assessed and were symmetrical.  No significant asymmetry bilaterally.  Coordination - The patient had normal movements in the hands with no ataxia or dysmetria.  Tremor was absent.  Gait and Station - deferred   ASSESSMENT/PLAN Ms. Lailyn SAMYUKTHA BRAU is a 76 y.o. female with history of mitral valve prolapse, liver disease, hyperlipidemia, gait disorder, atypical chest pain, arthritis, anxiety, and history of an abnormal heart rhythm presenting with left-sided weakness and numbness. She did not receive IV t-PA due to late presentation.  Left facial hypersensitivity - anxiety vs. trigeminal neuralgia  Resultant  Left V2-V3 hypersensitivity, especially left V2  CT head - No acute intracranial process.   MRI head - No acute intracranial process.   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  CTA head and neck - stable right V4 segment is calcified with fusiform enlargement up to 7 mm and severe tortuosity, but there is  no stenosis or saccular vertebral aneurysm. Supraclinoid right ICA tiny paraophthalmic artery aneurysm versus infundibulum  2D Echo - pending  LDL - 113  HgbA1c - 5.5  VTE prophylaxis - Lovenox  Diet NPO time specified  No antithrombotic prior to admission, now on aspirin 325 mg daily.  Recommend aspirin 81 daily on discharge.  Started gabapentin 100 mg twice daily for possible trigeminal neuralgia, patient will follow up with Dr. Jannifer Franklin  Patient counseled to be compliant with her antithrombotic medications  Ongoing aggressive stroke risk factor management  Therapy recommendations: - Outpatient PT  Disposition: Pending  Hypertension  Stable  Permissive hypertension (OK if < 220/120) but gradually normalize in 5-7 days  Long-term BP goal normotensive  Hyperlipidemia  Home meds:  Zetia 10 mg daily resumed in hospital  LDL 113, goal <  70  Statin intolerance for Lipitor and pravastatin - muscle aches   continue Zetia  Other Stroke Risk Factors  Advanced age  Former cigarette smoker - quit 50 years ago  Other Active Problems  Gait disorder - following with Dr. Jannifer Franklin at [facility] day # 0  Neurology will sign off. Please call with questions. Pt will follow up with Dr. Jannifer Franklin at [facility] in about 6 weeks. Thanks for the consult.  Rosalin Hawking, MD PhD Stroke Neurology 03/13/2017 3:33 PM    To contact Stroke Continuity provider, please refer to [email or web address] After hours, contact General Neurology

## 2017-03-13 NOTE — Discharge Instructions (Signed)
Follow with Dr. Jannifer Franklin  Please get a complete blood count and chemistry panel checked by your Primary MD at your next visit, and again as instructed by your Primary MD. Please get your medications reviewed and adjusted by your Primary MD.  Please request your Primary MD to go over all Hospital Tests and Procedure/Radiological results at the follow up, please get all Hospital records sent to your Prim MD by signing hospital release before you go home.  If you had Pneumonia of Lung problems at the Hospital: Please get a 2 view Chest X ray done in 6-8 weeks after hospital discharge or sooner if instructed by your Primary MD.  If you have Congestive Heart Failure: Please call your Cardiologist or Primary MD anytime you have any of the following symptoms:  1) 3 pound weight gain in 24 hours or 5 pounds in 1 week  2) shortness of breath, with or without a dry hacking cough  3) swelling in the hands, feet or stomach  4) if you have to sleep on extra pillows at night in order to breathe  Follow cardiac low salt diet and 1.5 lit/day fluid restriction.  If you have diabetes Accuchecks 4 times/day, Once in AM empty stomach and then before each meal. Log in all results and show them to your primary doctor at your next visit. If any glucose reading is under 80 or above 300 call your primary MD immediately.  If you have Seizure/Convulsions/Epilepsy: Please do not drive, operate heavy machinery, participate in activities at heights or participate in high speed sports until you have seen by Primary MD or a Neurologist and advised to do so again.  If you had Gastrointestinal Bleeding: Please ask your Primary MD to check a complete blood count within one week of discharge or at your next visit. Your endoscopic/colonoscopic biopsies that are pending at the time of discharge, will also need to followed by your Primary MD.  Get Medicines reviewed and adjusted. Please take all your medications with you for  your next visit with your Primary MD  Please request your Primary MD to go over all hospital tests and procedure/radiological results at the follow up, please ask your Primary MD to get all Hospital records sent to his/her office.  If you experience worsening of your admission symptoms, develop shortness of breath, life threatening emergency, suicidal or homicidal thoughts you must seek medical attention immediately by calling 911 or calling your MD immediately  if symptoms less severe.  You must read complete instructions/literature along with all the possible adverse reactions/side effects for all the Medicines you take and that have been prescribed to you. Take any new Medicines after you have completely understood and accpet all the possible adverse reactions/side effects.   Do not drive or operate heavy machinery when taking Pain medications.   Do not take more than prescribed Pain, Sleep and Anxiety Medications  Special Instructions: If you have smoked or chewed Tobacco  in the last 2 yrs please stop smoking, stop any regular Alcohol  and or any Recreational drug use.  Wear Seat belts while driving.  Please note You were cared for by a hospitalist during your hospital stay. If you have any questions about your discharge medications or the care you received while you were in the hospital after you are discharged, you can call the unit and asked to speak with the hospitalist on call if the hospitalist that took care of you is not available. Once you are discharged, your  primary care physician will handle any further medical issues. Please note that NO REFILLS for any discharge medications will be authorized once you are discharged, as it is imperative that you return to your primary care physician (or establish a relationship with a primary care physician if you do not have one) for your aftercare needs so that they can reassess your need for medications and monitor your lab values.  You can  reach the hospitalist office at phone [phone] or fax [phone]   If you do not have a primary care physician, you can call [phone] for a physician referral.  Activity: As tolerated with Full fall precautions use walker/cane & assistance as needed  Diet: regular  Disposition Home

## 2017-03-13 NOTE — Progress Notes (Signed)
Patient arrived from ED around [phone] alert and oriented with minor facial palsy, some balance issues that are related to another issue she has that may or may not be related to a CVA.Marland Kitchen

## 2017-03-13 NOTE — Progress Notes (Signed)
OT Evaluation  Pt does not demonstrate focal deficits during evaluation. Pt states " I'm better but I still have numbness in my face". Pt states " I'm just really stressed".Discussed coping mechanisms to reduce stress. Pt verbalized understanding. Pt safe to DC home when medically stable.    03/13/17 1200  OT Visit Information  Last OT Received On 03/13/17  Assistance Needed +1  History of Present Illness Pt is a 76 y/o female admitted secondary to L sided weakness. MRI was negative for any acute findings. PMH including but not limited to HLD and dolichoectatic calcified R vertebral artery.  Precautions  Precautions Fall  Restrictions  Weight Bearing Restrictions No  Home Living  Family/patient expects to be discharged to: Private residence  Living Arrangements Spouse/significant other  Available Help at Discharge Family;Available 24 hours/day  Type of Home House  Home Access Stairs to enter  Entrance Stairs-Number of Steps 2  Entrance Stairs-Rails None  Home Layout Two level;Able to live on main level with bedroom/bathroom  Alternate Level Stairs-Number of Steps flight  Bathroom Shower/Tub Walk-in Patent examiner - 2 wheels  Lives With Spouse  Prior Function  Level of Independence Independent  Comments works at [employer] No difficulties  Pain Assessment  Pain Assessment No/denies pain  Cognition  Arousal/Alertness Awake/alert  Behavior During Therapy WFL for tasks assessed/performed  Overall Cognitive Status Impaired/Different from baseline  Area of Impairment Attention;Memory  Current Attention Level Selective  Memory Decreased short-term memory  General Comments husband feels cognition is close to baseline  Upper Extremity Assessment  Upper Extremity Assessment Overall WFL for tasks assessed  Lower Extremity Assessment  Lower Extremity Assessment Defer to PT evaluation  Cervical / Trunk Assessment   Cervical / Trunk Assessment Normal  ADL  Overall ADL's  At baseline  General ADL Comments Educated on recommendation of using shower chair and cane to increased safety with mobility and ADL  Vision- History  Baseline Vision/History No visual deficits  Praxis  Praxis tested? WFL  Bed Mobility  Overal bed mobility Modified Independent  Transfers  Overall transfer level Modified independent  Equipment used None  Balance  Overall balance assessment Needs assistance;History of Falls  Sitting-balance support Feet supported  Sitting balance-Leahy Scale Normal  Standing balance support During functional activity;No upper extremity supported  Standing balance-Leahy Scale Fair  Standing balance comment LOB when turning head but self corrected  General Comments  General comments (skin integrity, edema, etc.) Pt discussed how she is "stressed". Discussed coping mechanisms for reducing stress. Pt verbalized understading.  OT - End of Session  Activity Tolerance Patient tolerated treatment well  Patient left in chair;with call bell/phone within reach;with chair alarm set;with family/visitor present  Nurse Communication Mobility status  OT Assessment  OT Recommendation/Assessment Patient does not need any further OT services  OT Visit Diagnosis Unsteadiness on feet (R26.81);Cognitive communication deficit (R41.841)  OT Problem List Impaired balance (sitting and/or standing);Decreased safety awareness  AM-PAC OT "6 Clicks" Daily Activity Outcome Measure  Help from another person eating meals? 4  Help from another person taking care of personal grooming? 4  Help from another person toileting, which includes using toliet, bedpan, or urinal? 4  Help from another person bathing (including washing, rinsing, drying)? 4  Help from another person to put on and taking off regular upper body clothing? 4  Help from another person to put on and taking off regular lower body clothing? 4  6 Click  Score 24   ADL G Code Conversion CH  OT Recommendation  Follow Up Recommendations No OT follow up;Supervision - Intermittent  OT Equipment Tub/shower seat  Acute Rehab OT Goals  Patient Stated Goal increased strength on L   OT Goal Formulation All assessment and education complete, DC therapy  OT Time Calculation  OT Start Time (ACUTE ONLY) 1115  OT Stop Time (ACUTE ONLY) 1132  OT Time Calculation (min) 17 min  OT General Charges  $OT Visit 1 Visit  OT Evaluation  $OT Eval Low Complexity 1 Low  Written Expression  Dominant Hand Right  [facility], OT/L  [phone] 03/13/2017

## 2017-03-13 NOTE — Progress Notes (Signed)
Patient seen and examined this morning, admitted overnight by Dr. Alcario Drought, agree with H&P, A/P   TIA workup almost complete except 2D echo, this is only test waiting for patient discharge.   Costin M. Cruzita Lederer, MD [facility] [phone]  If 7PM-7AM, please contact night-coverage [email or web address] Password [email or web address]

## 2017-03-13 NOTE — ED Notes (Signed)
Pt ambulatory to restroom with one assist; Dr.Gardner to bedside. Dr.Gardner made aware of NPO status due to failed swallow screen

## 2017-03-13 NOTE — ED Notes (Signed)
Patient transported to CT 

## 2017-03-13 NOTE — ED Notes (Signed)
Pt arrived from [facility] as Code Stroke, Dr.Kirkpatrick at bedside. Pt had L sided facial numbness, L leg numbness, and weaker grip to L hand. Pt also  Reports episodes of choking prior to eating dinner tonight. Facial droop noted with drift to L leg.

## 2017-03-13 NOTE — ED Provider Notes (Signed)
[facility] DEPT Provider Note   CSN: [id number] Arrival date & time: 03/12/17  2359     History   Chief Complaint Chief Complaint  Patient presents with  . Numbness    HPI Laura Pollard is a 76 y.o. female hx of HTN, abnormal basilar artery, vertigo, here presenting with left arm and leg weakness and numbness.  Patient states that she has been anxious all day has not been feeling well.  Around 11 PM, patient was about to go to bed and had acute onset of left face and arm and leg numbness.  She subsequently felt that the left arm and leg felt weak as well.  Denies any trouble speaking.  She states that she follows up with neurology (Dr. Jannifer Franklin) for postconcussive vertigo.  The history is provided by the patient.    Past Medical History:  Diagnosis Date  . Abnormal heart rhythm   . Anxiety   . Arthritis   . Atypical chest pain    normal ETT/Echo in 2003.. card cath normal 2010  . Concussion with loss of consciousness 04/27/2016  . Depression   . 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 Headache   . Hypercholesteremia   . Hyperlipidemia   . Liver disease   . Memory difficulties 02/05/2015  . Multiple allergies   . MVP (mitral valve prolapse)   . Nocturnal leg cramps 09/14/2014  . 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 Scoliosis     Patient Active Problem List   Diagnosis Date Noted  . Concussion with loss of consciousness 04/27/2016  . 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 Memory difficulties 02/05/2015  . 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 Dizziness and giddiness 09/14/2014  . Nocturnal leg cramps 09/14/2014    Past Surgical History:  Procedure Laterality Date  . CARDIAC CATHETERIZATION  2010   normal  . cataract surgery    . CESAREAN SECTION    . COLONOSCOPY    . ETT  2003   also ECHO..normal  . OVARIAN CYST SURGERY    . RETINAL DETACHMENT SURGERY Right 10 years ago  . TONSILLECTOMY      OB History    No data available       Home  Medications    Prior to Admission medications   Medication Sig Start Date End Date Taking? Authorizing Provider  ALPRAZolam [name]) 0.25 MG tablet Take 0.25 mg by mouth at bedtime as needed for anxiety.   Yes [provider]  Calcium-Vitamin D-Vitamin K 500-100-40 MG-UNT-MCG CHEW Chew 1 tablet by mouth daily.   Yes [provider]  cholecalciferol (VITAMIN D) 1000 UNITS tablet Take 2,000 Units by mouth daily.   Yes [provider]  Coenzyme Q10 (CO Q 10 PO) Take 1 capsule by mouth daily.   Yes [provider]  Cyanocobalamin 2500 MCG TABS Take 1 tablet by mouth daily.   Yes [provider]  ezetimibe (ZETIA) 10 MG tablet Take 10 mg by mouth daily.   Yes [provider]  FLUoxetine (PROZAC) 40 MG capsule Take 40 mg by mouth daily.   Yes [provider]  Glucosamine HCl 1500 MG TABS Take 1 tablet by mouth 2 (two) times daily.   Yes [provider]  Omega 3 1200 MG CAPS Take 1 capsule by mouth daily.   Yes [provider]  rivastigmine (EXELON) 4.6 mg/24hr PLACE 1 PATCH ONTO THE SKIN EVERY DAY 02/08/17  Yes Kathrynn Ducking, MD  Turmeric Curcumin 500 MG CAPS Take 1  capsule by mouth daily.   Yes [provider]    Family History Family History  Problem Relation Age of Onset  . Heart disease Mother   . CAD Mother   . AAA (abdominal aortic aneurysm) Father   . Bladder Cancer Father   . Kidney cancer Sister        metastasis to lung  . HIV Son     Social History Social History  Substance Use Topics  . Smoking status: Former Smoker    Quit date: 05/11/1966  . Smokeless tobacco: Never Used  . Alcohol use 0.0 oz/week     Comment: rarely     Allergies   Indocin [indomethacin]; Keflex [cephalexin]; Lipitor [atorvastatin]; Pravastatin; and Tape   Review of Systems Review of Systems  Neurological: Positive for weakness.  All other systems reviewed and are negative.    Physical Exam Updated  Vital Signs BP (!) 147/81 (BP Location: Left Arm)   Pulse 64   Temp (!) 97.5 F (36.4 C) (Oral)   Resp 18   Ht 5\' 8"  (1.727 m)   Wt 68 kg (150 lb)   SpO2 95%   BMI 22.81 kg/m   Physical Exam  Constitutional: She is oriented to person, place, and time. She appears well-developed.  HENT:  Head: Normocephalic.  Mouth/Throat: Oropharynx is clear and moist.  Eyes: Pupils are equal, round, and reactive to light. Conjunctivae and EOM are normal.  Neck: Normal range of motion. Neck supple.  Cardiovascular: Normal rate, regular rhythm, normal heart sounds and intact distal pulses.   Pulmonary/Chest: Effort normal and breath sounds normal. No respiratory distress. She has no wheezes.  Abdominal: Soft. Bowel sounds are normal. She exhibits no distension. There is no tenderness.  Musculoskeletal: Normal range of motion.  Neurological: She is alert and oriented to person, place, and time.  ? Mild L facial droop. Dec sensation L face, arm, leg. Strength 4/5 L arm and leg, 5/5 R side. Abnormal finger to nose L side, nl on right side   Skin: Skin is warm.  Psychiatric: She has a normal mood and affect.  Nursing note and vitals reviewed.    ED Treatments / Results  Labs (all labs ordered are listed, but only abnormal results are displayed) Labs Reviewed  COMPREHENSIVE METABOLIC PANEL - Abnormal; Notable for the following:       Result Value   Glucose, Bld 104 (*)    All other components within normal limits  CBC  I-STAT TROPONIN, ED  POCT I-STAT TROPONIN I    EKG  EKG Interpretation  Date/Time:  Saturday March 13 2017 00:27:43 EDT Ventricular Rate:  60 PR Interval:    QRS Duration: 179 QT Interval:  398 QTC Calculation: 395 R Axis:   11 Text Interpretation:  Sinus rhythm Nonspecific intraventricular conduction delay Borderline abnrm T, anterolateral leads No significant change since last tracing Confirmed by Wandra Arthurs [id number]) on 03/13/2017 1:33:44 AM       Radiology Dg  Chest 2 View  Result Date: 03/13/2017 CLINICAL DATA:  76 year old female with numbness. EXAM: CHEST  2 VIEW COMPARISON:  Chest radiograph dated 03/04/2010 FINDINGS: Stable borderline cardiomegaly. There is no focal consolidation, pleural effusion, or pneumothorax. There is osteopenia with degenerative changes of the spine. No acute osseous pathology. IMPRESSION: No acute cardiopulmonary process. Stable mild cardiomegaly. Electronically Signed   By: Anner Crete M.D.   On: 03/13/2017 01:34    Procedures Procedures (including critical care time)  CRITICAL CARE Performed by:  Wandra Arthurs   Total critical care time: 30 minutes  Critical care time was exclusive of separately billable procedures and treating other patients.  Critical care was necessary to treat or prevent imminent or life-threatening deterioration.  Critical care was time spent personally by me on the following activities: development of treatment plan with patient and/or surrogate as well as nursing, discussions with consultants, evaluation of patient's response to treatment, examination of patient, obtaining history from patient or surrogate, ordering and performing treatments and interventions, ordering and review of laboratory studies, ordering and review of radiographic studies, pulse oximetry and re-evaluation of patient's condition.   Medications Ordered in ED Medications - No data to display   Initial Impression / Assessment and Plan / ED Course  I have reviewed the triage vital signs and the nursing notes.  Pertinent labs & imaging results that were available during my care of the patient were reviewed by me and considered in my medical decision making (see chart for details).     Sheryl FRANCELLA BARNETT is a 76 y.o. female here with L arm and leg weakness, L sided numbness. Her symptoms happened around 11 pm, so within TPA window. I called Dr. Leonel Ramsay, who recommend activate code stroke immediately. Code stroke  activated at 2 am. Her weakness is improving so will hold off TPA until neurology sees patient. Dr. Roxanne Mins in the ED at [facility] aware of patient.    Final Clinical Impressions(s) / ED Diagnoses   Final diagnoses:  Weakness  Cerebrovascular accident (CVA), unspecified mechanism [facility])    New Prescriptions New Prescriptions   No medications on file     Drenda Freeze, MD 03/13/17 0201

## 2017-03-14 ENCOUNTER — Telehealth: Payer: Self-pay | Admitting: *Deleted

## 2017-03-14 NOTE — Telephone Encounter (Signed)
CM received call from Discharging MD that pt needed Putpt PT. AVS contains post discharge instructions to call [facility] to schedule follow up with Neuro after discharge. CM explained this is the same location as Out pt PT. Laura Pollard would like to wait and see her MD and make a decision about Outpt PT at that time. CM made her aware all orders are in the chart and ready if she decides differently she can proceed by calling the number on the AVS. CM will sign off for now but will be available should additional discharge needs arise or disposition change.

## 2017-03-15 ENCOUNTER — Telehealth: Payer: Self-pay | Admitting: *Deleted

## 2017-03-15 NOTE — Telephone Encounter (Signed)
Called pt. Scheduled appt for 04/12/17 at 730am for 4-6 week f/u per CW,MD request. Pt verbalized understanding and appreciation for call.

## 2017-03-19 NOTE — Progress Notes (Signed)
PT Addendum for G-Codes    03/13/17 1001  PT G-Codes **NOT FOR INPATIENT CLASS**  Functional Assessment Tool Used AM-PAC 6 Clicks Basic Mobility;Clinical judgement  Functional Limitation Mobility: Walking and moving around  Mobility: Walking and Moving Around Current Status (I2035) CJ  Mobility: Walking and Moving Around Goal Status (D9741) CH  Mobility: Walking and Moving Around Discharge Status [phone]) [place], [place], DPT [phone]

## 2017-04-03 NOTE — Progress Notes (Signed)
   03/13/17 0849  SLP G-Codes **NOT FOR INPATIENT CLASS**  Functional Assessment Tool Used skilled clinical judgment  Functional Limitations Memory  Memory Current Status (O1561) CI  Memory Goal Status (B3794) CI   Deneise Lever, MS, Actor

## 2017-04-12 ENCOUNTER — Ambulatory Visit: Payer: Self-pay | Admitting: Neurology

## 2017-04-12 ENCOUNTER — Encounter: Payer: Self-pay | Admitting: Neurology

## 2017-04-12 ENCOUNTER — Ambulatory Visit: Payer: PPO | Admitting: Neurology

## 2017-04-12 VITALS — BP 126/79 | HR 68 | Ht 68.0 in | Wt 152.0 lb

## 2017-04-12 DIAGNOSIS — R413 Other amnesia: Secondary | ICD-10-CM | POA: Diagnosis not present

## 2017-04-12 DIAGNOSIS — G459 Transient cerebral ischemic attack, unspecified: Secondary | ICD-10-CM | POA: Diagnosis not present

## 2017-04-12 DIAGNOSIS — R269 Unspecified abnormalities of gait and mobility: Secondary | ICD-10-CM

## 2017-04-12 DIAGNOSIS — R42 Dizziness and giddiness: Secondary | ICD-10-CM

## 2017-04-12 NOTE — Progress Notes (Signed)
Reason for visit: TIA  Referring physician: [facility]  Laura Pollard is a 76 y.o. female  History of present illness:  Laura Pollard is a 76 year old right-handed white female with a history of anxiety, and chronic dizziness and a gait disorder.  The patient also reports some troubles with a memory disorder.  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  She went into the hospital on 13 March 2017 with onset of left-sided numbness and weakness that included the face, arm, and leg.  She believes that the symptoms lasted for several hours and then cleared.  The patient denied any visual change, she denied any slurred speech.  She is having some slight headaches off and on possibly up to twice a week.  The patient normally does not have headache.  The patient has been placed on aspirin.  The stroke workup was completely negative.  There is some question of a small paraophthalmic aneurysm versus an infundibulum.  A 2D echocardiogram was unremarkable.  The patient underwent CT angiogram as well without evidence of large vessel blockages.  The patient has fully recovered, she is back to her baseline.  The patient is still working, but she indicates that she will be retiring within the next several days.  The patient returns on an urgent basis for follow-up from the hospitalization.  Past Medical History:  Diagnosis Date  . Abnormal heart rhythm   . Anxiety   . Arthritis   . Atypical chest pain    normal ETT/Echo in 2003.. card cath normal 2010  . Concussion with loss of consciousness 04/27/2016  . Depression   . 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 Headache   . Hypercholesteremia   . Hyperlipidemia   . Liver disease   . Memory difficulties 02/05/2015  . Multiple allergies   . MVP (mitral valve prolapse)   . Nocturnal leg cramps 09/14/2014  . 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 Scoliosis     Past Surgical History:    Procedure Laterality Date  . CARDIAC CATHETERIZATION  2010   normal  . cataract surgery    . CESAREAN SECTION    . COLONOSCOPY    . ETT  2003   also ECHO..normal  . OVARIAN CYST SURGERY    . RETINAL DETACHMENT SURGERY Right 10 years ago  . TONSILLECTOMY      Family History  Problem Relation Age of Onset  . Heart disease Mother   . CAD Mother   . AAA (abdominal aortic aneurysm) Father   . Bladder Cancer Father   . Kidney cancer Sister        metastasis to lung  . HIV Son     Social history:  reports that she quit smoking about 50 years ago. she has never used smokeless tobacco. She reports that she drinks alcohol. She reports that she does not use drugs.  Medications:  Prior to Admission medications   Medication Sig Start Date End Date Taking? Authorizing Provider  ALPRAZolam [name]) 0.25 MG tablet Take 0.25 mg by mouth at bedtime as needed for anxiety.   Yes [provider]  aspirin EC 81 MG EC tablet Take 1 tablet (81 mg total) by mouth daily. 03/14/17  Yes Caren Griffins, MD  Calcium-Vitamin D-Vitamin K 500-100-40 MG-UNT-MCG CHEW Chew 1 tablet by mouth daily.   Yes [provider]  cholecalciferol (VITAMIN D) 1000 UNITS tablet Take 2,000 Units by mouth daily.  Yes [provider]  Coenzyme Q10 (CO Q 10 PO) Take 1 capsule by mouth daily.   Yes [provider]  Cyanocobalamin 2500 MCG TABS Take 1 tablet by mouth daily.   Yes [provider]  ezetimibe (ZETIA) 10 MG tablet Take 10 mg by mouth daily.   Yes [provider]  FLUoxetine (PROZAC) 40 MG capsule Take 40 mg by mouth daily.   Yes [provider]  Glucosamine HCl 1500 MG TABS Take 1 tablet by mouth 2 (two) times daily.   Yes [provider]  Omega 3 1200 MG CAPS Take 1 capsule by mouth daily.   Yes [provider]  rivastigmine (EXELON) 4.6 mg/24hr PLACE 1 PATCH ONTO THE SKIN EVERY DAY 02/08/17  Yes Kathrynn Ducking, MD  Turmeric  Curcumin 500 MG CAPS Take 1 capsule by mouth daily.   Yes [provider]      Allergies  Allergen Reactions  . Gabapentin     Dizziness   . Indocin [Indomethacin] Other (See Comments)    Nausea and vomiting   . Keflex [Cephalexin] Other (See Comments)    unknown reaction   . Lipitor [Atorvastatin] Other (See Comments)    Muscle aches   . Pravastatin Other (See Comments)    Muscle aches   . Tape Rash    PAPER TAPE --- RASH    ROS:  Out of a complete 14 system review of symptoms, the patient complains only of the following symptoms, and all other reviewed systems are negative.  Fatigue Runny nose Shortness of breath Heat intolerance Insomnia incontinence of the bladder Back pain Dizziness, weakness Depression, anxiety  Blood pressure 126/79, pulse 68, height 5\' 8"  (1.727 m), weight 152 lb (68.9 kg).  Physical Exam  General: The patient is alert and cooperative at the time of the examination.  Eyes: Pupils are equal, round, and reactive to light. Discs are flat bilaterally.  Neck: The neck is supple, no carotid bruits are noted.  Respiratory: The respiratory examination is clear.  Cardiovascular: The cardiovascular examination reveals a regular rate and rhythm, no obvious murmurs or rubs are noted.  Skin: Extremities are without significant edema.  Neurologic Exam  Mental status: The patient is alert and oriented x 3 at the time of the examination. The patient has apparent normal recent and remote memory, with an apparently normal attention span and concentration ability.  Cranial nerves: Facial symmetry is present. There is good sensation of the face to pinprick and soft touch bilaterally. The strength of the facial muscles and the muscles to head turning and shoulder shrug are normal bilaterally. Speech is well enunciated, no aphasia or dysarthria is noted. Extraocular movements are full. Visual fields are full. The tongue is midline, and the patient  has symmetric elevation of the soft palate. No obvious hearing deficits are noted.  Motor: The motor testing reveals 5 over 5 strength of all 4 extremities. Good symmetric motor tone is noted throughout.  Sensory: Sensory testing is intact to pinprick, soft touch and vibration sensation on all 4 extremities. No evidence of extinction is noted.  Coordination: Cerebellar testing reveals good finger-nose-finger and heel-to-shin bilaterally.  Gait and station: Gait is normal. Tandem gait is unsteady. Romberg is negative. No drift is seen.  Reflexes: Deep tendon reflexes are symmetric and normal bilaterally. Toes are downgoing bilaterally.    MRI brain/ MRA head 03/13/17:   IMPRESSION: MRI HEAD:  1. No acute intracranial process. 2. Stable moderate parenchymal brain volume  loss and mild chronic small vessel ischemic disease.  MRA HEAD:  1. No emergent large vessel occlusion or flow limiting stenosis. 2. 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  * MRI scan images were reviewed online. I agree with the written report.    2D echo 03/13/17:  Study Conclusions  - Left ventricle: The cavity size was normal. There was mild   concentric hypertrophy. Systolic function was normal. The   estimated ejection fraction was in the range of 60% to 65%. Wall   motion was normal; there were no regional wall motion   abnormalities. Doppler parameters are consistent with abnormal   left ventricular relaxation (grade 1 diastolic dysfunction).   There was no evidence of elevated ventricular filling pressure by   Doppler parameters. - Aortic valve: There was no regurgitation. - Aortic root: The aortic root was normal in size. - Mitral valve: There was trivial regurgitation. - Left atrium: The atrium was normal in size. - Right ventricle: Systolic function was normal. - Right atrium: The atrium  was normal in size. - Tricuspid valve: There was trivial regurgitation. - Pulmonary arteries: Systolic pressure was within the normal   range. - Inferior vena cava: The vessel was normal in size. - Pericardium, extracardiac: There was no pericardial effusion.   CTA head and neck 03/13/17:  IMPRESSION: 1. Negative for emergent large vessel occlusion, and no arterial stenosis in the head or neck. 2. The right vertebral artery is dominant and dolichoectatic throughout its course. The right V4 segment is calcified with fusiform enlargement up to 7 mm and severe tortuosity, but there is no stenosis or saccular vertebral aneurysm. 3. 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 4.  Stable CT appearance of the brain.   Assessment/Plan:  1.  Possible TIA  2.  Chronic dizziness  3.  Anxiety and depression  4.  Mild cognitive impairment  5.  Gait instability  The patient is now on aspirin.  The stroke workup was unremarkable.  The patient continues with her baseline dizziness that may be related to brainstem compression from the tortuous basilar artery.  The patient continues to have some memory problems, she is on the Exelon patch.  She will follow-up for next scheduled revisit in July 2019.  Jill Alexanders MD 04/12/2017 7:36 AM  [facility] [address] [place], [place] [address]  Phone [phone] Fax [phone]

## 2017-05-10 DIAGNOSIS — F3341 Major depressive disorder, recurrent, in partial remission: Secondary | ICD-10-CM | POA: Diagnosis not present

## 2017-05-27 ENCOUNTER — Other Ambulatory Visit: Payer: Self-pay | Admitting: Family Medicine

## 2017-05-27 DIAGNOSIS — Z1231 Encounter for screening mammogram for malignant neoplasm of breast: Secondary | ICD-10-CM

## 2017-06-18 DIAGNOSIS — F325 Major depressive disorder, single episode, in full remission: Secondary | ICD-10-CM | POA: Diagnosis not present

## 2017-06-18 DIAGNOSIS — E785 Hyperlipidemia, unspecified: Secondary | ICD-10-CM | POA: Diagnosis not present

## 2017-06-18 DIAGNOSIS — Z780 Asymptomatic menopausal state: Secondary | ICD-10-CM | POA: Diagnosis not present

## 2017-06-18 DIAGNOSIS — R413 Other amnesia: Secondary | ICD-10-CM | POA: Diagnosis not present

## 2017-06-18 DIAGNOSIS — F411 Generalized anxiety disorder: Secondary | ICD-10-CM | POA: Diagnosis not present

## 2017-06-18 DIAGNOSIS — G459 Transient cerebral ischemic attack, unspecified: Secondary | ICD-10-CM | POA: Diagnosis not present

## 2017-06-23 ENCOUNTER — Ambulatory Visit
Admission: RE | Admit: 2017-06-23 | Discharge: 2017-06-23 | Disposition: A | Discharge status: Home / Self Care | Payer: PPO | Source: Ambulatory Visit | Attending: Family Medicine | Admitting: Family Medicine

## 2017-06-23 DIAGNOSIS — Z1231 Encounter for screening mammogram for malignant neoplasm of breast: Secondary | ICD-10-CM | POA: Diagnosis not present

## 2017-06-29 DIAGNOSIS — F3341 Major depressive disorder, recurrent, in partial remission: Secondary | ICD-10-CM | POA: Diagnosis not present

## 2017-08-26 DIAGNOSIS — Z780 Asymptomatic menopausal state: Secondary | ICD-10-CM | POA: Diagnosis not present

## 2017-09-08 DIAGNOSIS — E785 Hyperlipidemia, unspecified: Secondary | ICD-10-CM | POA: Diagnosis not present

## 2017-09-08 DIAGNOSIS — Z79899 Other long term (current) drug therapy: Secondary | ICD-10-CM | POA: Diagnosis not present

## 2017-09-08 DIAGNOSIS — Z86010 Personal history of colonic polyps: Secondary | ICD-10-CM | POA: Diagnosis not present

## 2017-09-08 DIAGNOSIS — E781 Pure hyperglyceridemia: Secondary | ICD-10-CM | POA: Diagnosis not present

## 2017-09-08 DIAGNOSIS — Z6823 Body mass index (BMI) 23.0-23.9, adult: Secondary | ICD-10-CM | POA: Diagnosis not present

## 2017-09-17 DIAGNOSIS — M7130 Other bursal cyst, unspecified site: Secondary | ICD-10-CM | POA: Diagnosis not present

## 2017-09-17 DIAGNOSIS — L84 Corns and callosities: Secondary | ICD-10-CM | POA: Diagnosis not present

## 2017-09-17 DIAGNOSIS — L821 Other seborrheic keratosis: Secondary | ICD-10-CM | POA: Diagnosis not present

## 2017-11-09 ENCOUNTER — Ambulatory Visit: Payer: PPO | Admitting: Neurology

## 2017-11-09 ENCOUNTER — Other Ambulatory Visit: Payer: Self-pay

## 2017-11-09 ENCOUNTER — Encounter: Payer: Self-pay | Admitting: Neurology

## 2017-11-09 VITALS — BP 128/70 | HR 84 | Ht 67.0 in | Wt 152.0 lb

## 2017-11-09 DIAGNOSIS — R42 Dizziness and giddiness: Secondary | ICD-10-CM | POA: Diagnosis not present

## 2017-11-09 DIAGNOSIS — R413 Other amnesia: Secondary | ICD-10-CM | POA: Diagnosis not present

## 2017-11-09 NOTE — Progress Notes (Signed)
Reason for visit: Memory disturbance  Laura Pollard is an 77 y.o. female  History of present illness:  Laura Pollard is a 77 year old right-handed white female with a history of a mild memory disturbance.  The patient has done relatively well since last seen, she currently is retired.  She has not had any significant change in her cognitive status since last seen.  The patient is on Exelon patch, she tolerates this fairly well.  She does have some slight episodic dizziness, this is not a significant problem for her.  The patient comes to this office for further evaluation.  No other new medical issues have come up since last seen.  Past Medical History:  Diagnosis Date  . Abnormal heart rhythm   . Anxiety   . Arthritis   . Atypical chest pain    normal ETT/Echo in 2003.. card cath normal 2010  . Concussion with loss of consciousness 04/27/2016  . Depression   . 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 Headache   . Hypercholesteremia   . Hyperlipidemia   . Liver disease   . Memory difficulties 02/05/2015  . Multiple allergies   . MVP (mitral valve prolapse)   . Nocturnal leg cramps 09/14/2014  . 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 Scoliosis     Past Surgical History:  Procedure Laterality Date  . CARDIAC CATHETERIZATION  2010   normal  . cataract surgery    . CESAREAN SECTION    . COLONOSCOPY    . ETT  2003   also ECHO..normal  . OVARIAN CYST SURGERY    . RETINAL DETACHMENT SURGERY Right 10 years ago  . TONSILLECTOMY      Family History  Problem Relation Age of Onset  . Heart disease Mother   . CAD Mother   . AAA (abdominal aortic aneurysm) Father   . Bladder Cancer Father   . Kidney cancer Sister        metastasis to lung  . HIV Son   . Breast cancer Maternal Aunt   . Breast cancer Cousin     Social history:  reports that she quit smoking about 51 years ago. She has never used smokeless tobacco. She reports that she drinks alcohol. She reports that she does not use  drugs.    Allergies  Allergen Reactions  . Gabapentin     Dizziness   . Indocin [Indomethacin] Other (See Comments)    Nausea and vomiting   . Keflex [Cephalexin] Other (See Comments)    unknown reaction   . Lipitor [Atorvastatin] Other (See Comments)    Muscle aches   . Pravastatin Other (See Comments)    Muscle aches   . Tape Rash    PAPER TAPE --- RASH    Medications:  Prior to Admission medications   Medication Sig Start Date End Date Taking? Authorizing Provider  ALPRAZolam [name]) 0.5 MG tablet Take 0.5 mg by mouth at bedtime as needed for anxiety.   Yes [provider]  aspirin EC 81 MG EC tablet Take 1 tablet (81 mg total) by mouth daily. 03/14/17  Yes Caren Griffins, MD  Calcium-Vitamin D-Vitamin K 500-100-40 MG-UNT-MCG CHEW Chew 1 tablet by mouth daily.   Yes [provider]  cholecalciferol (VITAMIN D) 1000 UNITS tablet Take 2,000 Units by mouth daily.   Yes [provider]  Coenzyme Q10 (CO Q 10 PO) Take 1 capsule by mouth daily.   Yes [provider]  Cyanocobalamin 2500 MCG TABS Take 1  tablet by mouth daily.   Yes [provider]  FLUoxetine (PROZAC) 20 MG tablet Take 20 mg by mouth daily.   Yes [provider]  Glucosamine HCl 1500 MG TABS Take 1 tablet by mouth 2 (two) times daily.   Yes [provider]  Omega 3 1200 MG CAPS Take 1 capsule by mouth daily.   Yes [provider]  rivastigmine (EXELON) 4.6 mg/24hr PLACE 1 PATCH ONTO THE SKIN EVERY DAY 02/08/17  Yes Kathrynn Ducking, MD  Turmeric Curcumin 500 MG CAPS Take 1 capsule by mouth daily.   Yes [provider]    ROS:  Out of a complete 14 system review of symptoms, the patient complains only of the following symptoms, and all other reviewed systems are negative.  Fatigue Runny nose, difficulty swallowing Shortness of breath Insomnia, frequent waking, snoring Back pain Memory loss  Blood pressure 128/70, pulse 84,  height 5\' 7"  (1.702 m), weight 152 lb (68.9 kg).  Physical Exam  General: The patient is alert and cooperative at the time of the examination.  Skin: No significant peripheral edema is noted.   Neurologic Exam  Mental status: The patient is alert and oriented x 3 at the time of the examination. The patient has apparent normal recent and remote memory, with an apparently normal attention span and concentration ability.  Mini-Mental status examination done today shows a total score 29/30.   Cranial nerves: Facial symmetry is present. Speech is normal, no aphasia or dysarthria is noted. Extraocular movements are full. Visual fields are full.  Motor: The patient has good strength in all 4 extremities.  Sensory examination: Soft touch sensation is symmetric on the face, arms, and legs.  Coordination: The patient has good finger-nose-finger and heel-to-shin bilaterally.  Gait and station: The patient has a normal gait. Tandem gait is unsteady. Romberg is negative, but is unsteady. No drift is seen.  Reflexes: Deep tendon reflexes are symmetric.   Assessment/Plan:  1.  Chronic dizziness  2.  Mild memory disturbance, mild cognitive impairment  The patient will continue the Exelon patch.  She will follow-up through this office in 9 months.  She is doing well overall.  She does report some episodes of feeling like things are getting caught in her esophagus, she has pressure in the chest when she tries to eat steak or other foods that are not soft.  She may have an esophageal stricture, if this continues she may need to contact her primary care physician regarding this.  Laura Alexanders MD 11/09/2017 10:51 AM  [facility] [address] [place], [place] [address]  Phone [phone] Fax [phone]

## 2018-02-10 DIAGNOSIS — F3341 Major depressive disorder, recurrent, in partial remission: Secondary | ICD-10-CM | POA: Diagnosis not present

## 2018-02-11 DIAGNOSIS — Z205 Contact with and (suspected) exposure to viral hepatitis: Secondary | ICD-10-CM | POA: Diagnosis not present

## 2018-02-11 DIAGNOSIS — Z23 Encounter for immunization: Secondary | ICD-10-CM | POA: Diagnosis not present

## 2018-03-06 ENCOUNTER — Other Ambulatory Visit: Payer: Self-pay | Admitting: Neurology

## 2018-03-18 DIAGNOSIS — E785 Hyperlipidemia, unspecified: Secondary | ICD-10-CM | POA: Diagnosis not present

## 2018-03-18 DIAGNOSIS — Z0000 Encounter for general adult medical examination without abnormal findings: Secondary | ICD-10-CM | POA: Diagnosis not present

## 2018-03-18 DIAGNOSIS — N907 Vulvar cyst: Secondary | ICD-10-CM | POA: Diagnosis not present

## 2018-03-18 DIAGNOSIS — Z79899 Other long term (current) drug therapy: Secondary | ICD-10-CM | POA: Diagnosis not present

## 2018-03-18 DIAGNOSIS — Z23 Encounter for immunization: Secondary | ICD-10-CM | POA: Diagnosis not present

## 2018-06-02 ENCOUNTER — Other Ambulatory Visit: Payer: Self-pay | Admitting: Family Medicine

## 2018-06-02 DIAGNOSIS — Z1231 Encounter for screening mammogram for malignant neoplasm of breast: Secondary | ICD-10-CM

## 2018-06-15 DIAGNOSIS — F3341 Major depressive disorder, recurrent, in partial remission: Secondary | ICD-10-CM | POA: Diagnosis not present

## 2018-07-05 ENCOUNTER — Ambulatory Visit
Admission: RE | Admit: 2018-07-05 | Discharge: 2018-07-05 | Disposition: A | Discharge status: Home / Self Care | Payer: PPO | Source: Ambulatory Visit | Attending: Family Medicine | Admitting: Family Medicine

## 2018-07-05 DIAGNOSIS — Z1231 Encounter for screening mammogram for malignant neoplasm of breast: Secondary | ICD-10-CM | POA: Diagnosis not present

## 2018-08-11 ENCOUNTER — Ambulatory Visit: Payer: PPO | Admitting: Neurology

## 2018-08-17 DIAGNOSIS — F3341 Major depressive disorder, recurrent, in partial remission: Secondary | ICD-10-CM | POA: Diagnosis not present

## 2018-11-14 IMAGING — CT CT HEAD W/O CM
3 of 4 series · 15 of 47 positions shown, 18 images · non-contrast
Comparison: CT HEAD May 06, 2016

CLINICAL DATA: LEFT facial numbness beginning at 11 p.m., feeling
unwell today. Confusion. History of memory difficulties, post
concussion vertigo, hypercholesterolemia.

EXAM:
CT HEAD WITHOUT CONTRAST
TECHNIQUE: Contiguous axial images were obtained from the base of the skull
through the vertex without intravenous contrast.

[Series 2: head w/o · axial · non-contrast · 0.42mm/px · z∈[-154,-34]mm · 9 of 28 slices shown, 12 images]
[im 2/28  brain]
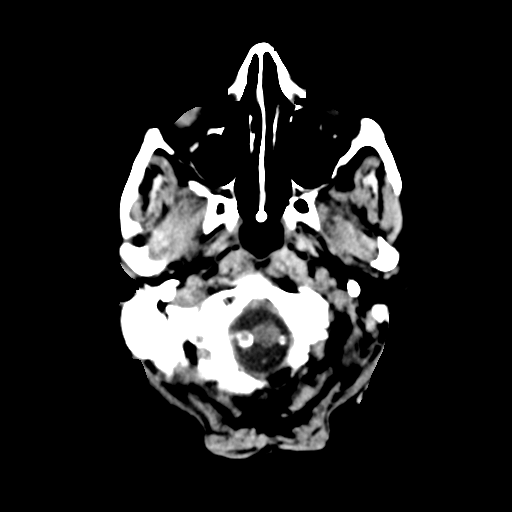
[im 2/28  bone]
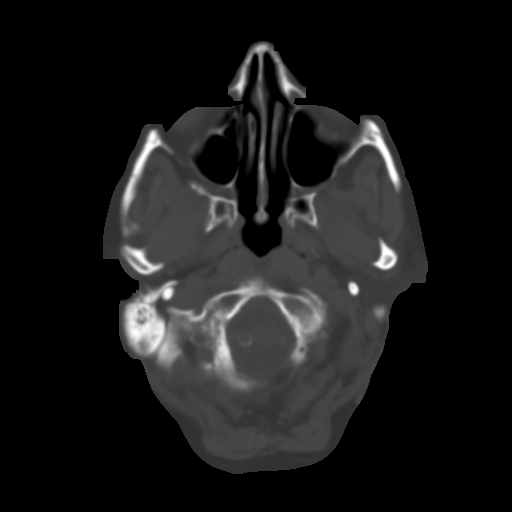
[im 6/28  brain]
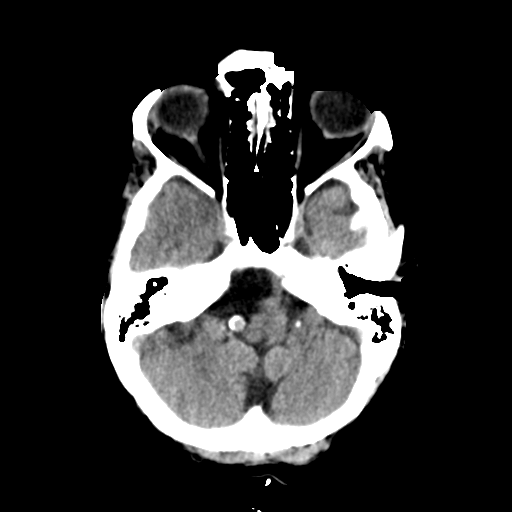
[im 8/28  brain]
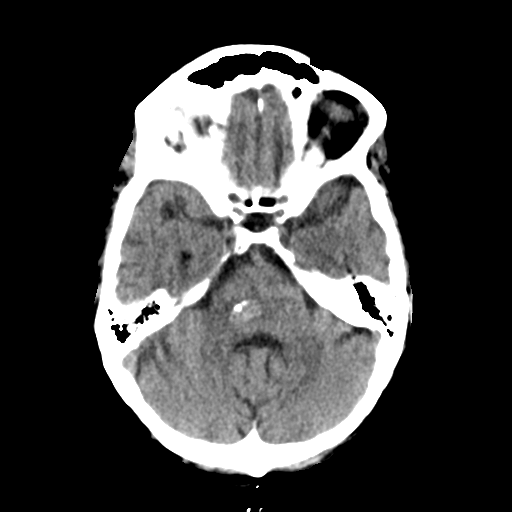
[im 12/28  brain]
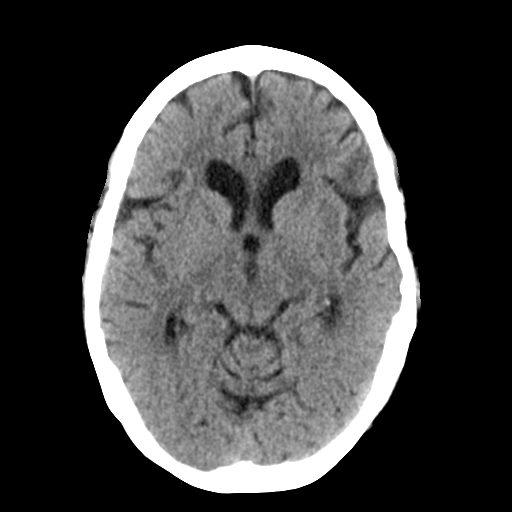
[im 14/28  brain]
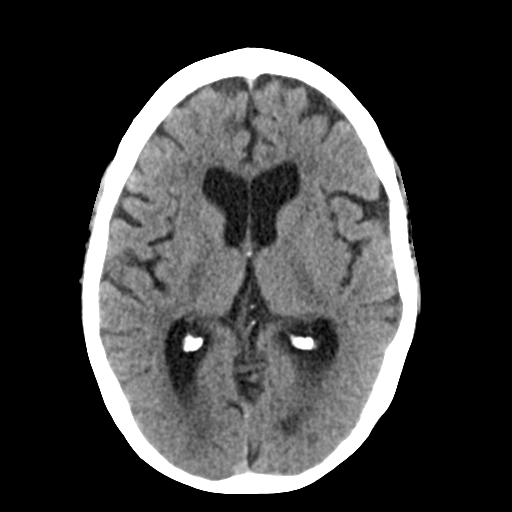
[im 14/28  bone]
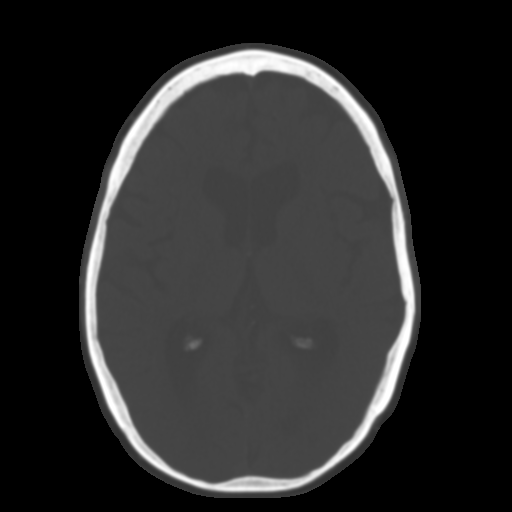
[im 16/28  brain]
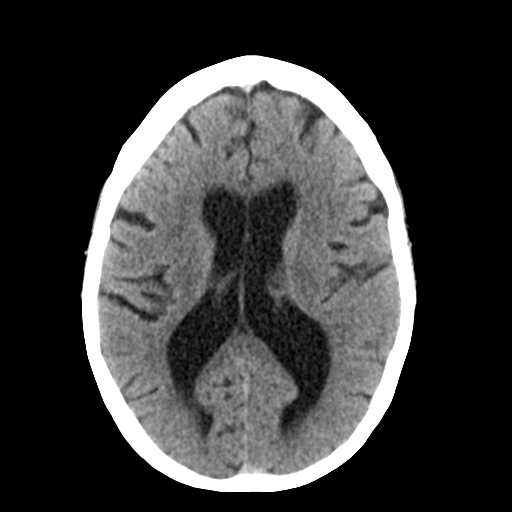
[im 20/28  brain]
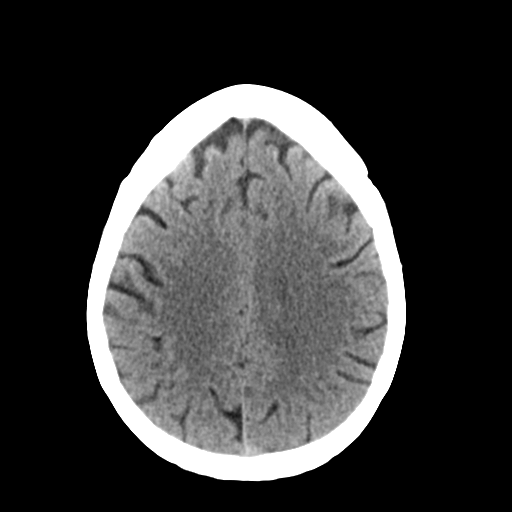
[im 22/28  brain]
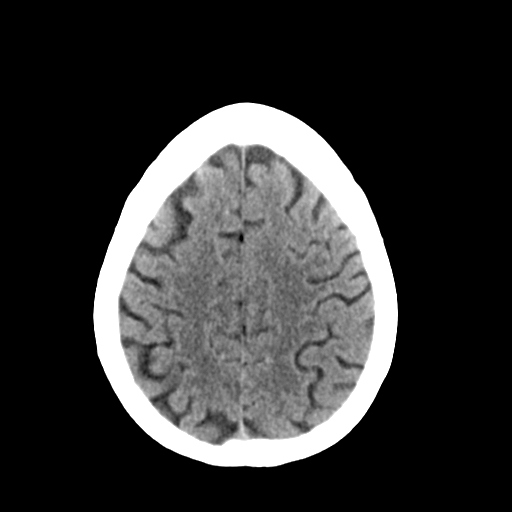
[im 26/28  brain]
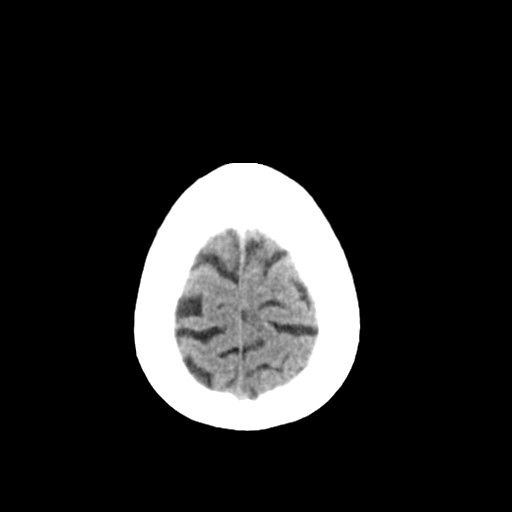
[im 26/28  bone]
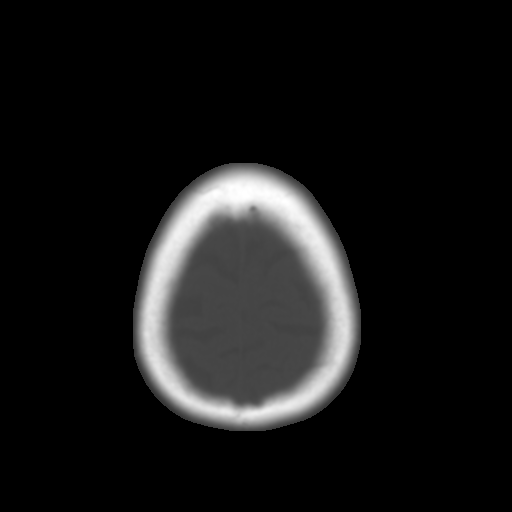

[Series 5: coronal · coronal · 0.27mm/px · 3 of 64 slices shown]
[im 22/64  brain]
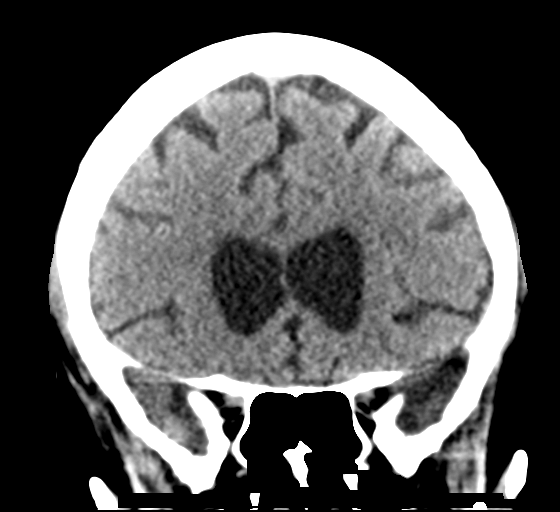
[im 29/64  brain]
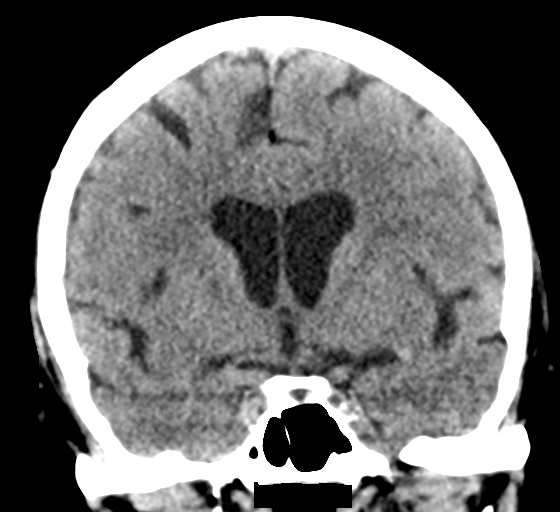
[im 36/64  brain]
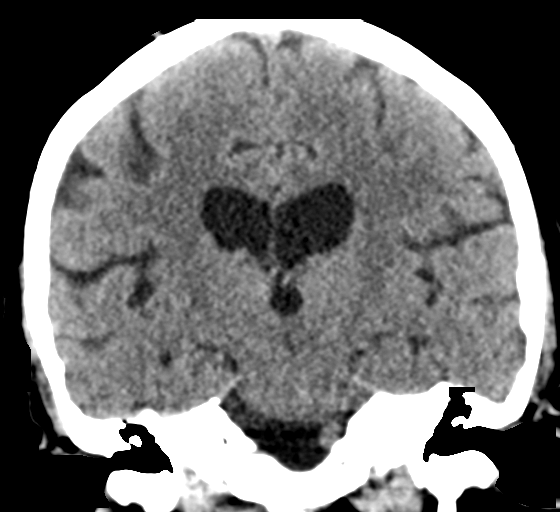

[Series 6: sagittal · sagittal · 0.30mm/px · 3 of 46 slices shown]
[im 16/46  brain]
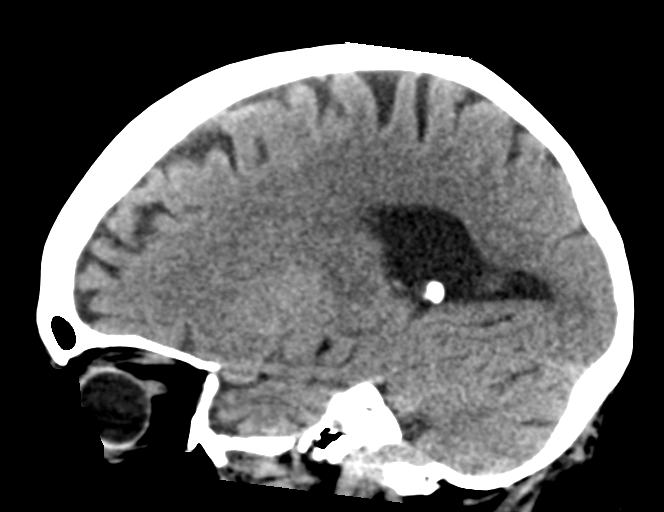
[im 23/46  brain]
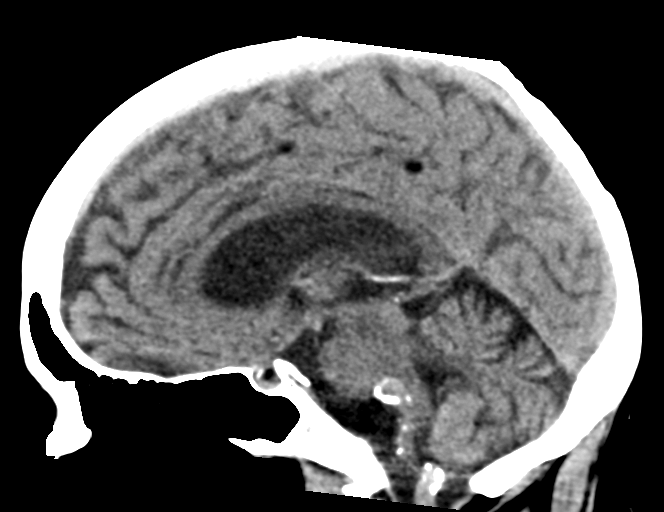
[im 31/46  brain]
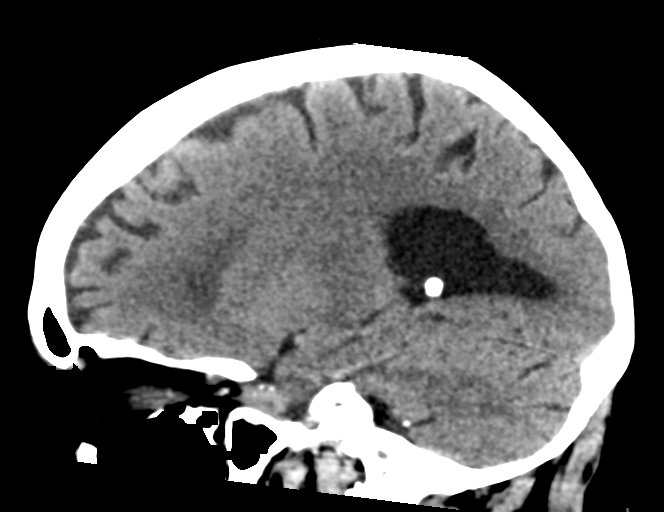

[15 of 47 positions shown; findings below may reference images not displayed]

FINDINGS: BRAIN: No intraparenchymal hemorrhage, mass effect nor midline
shift. The ventricles and sulci are normal for age. Minimal
supratentorial white matter hypodensities less than expected for
patient's age, though non-specific are most compatible with chronic
small vessel ischemic disease. No acute large vascular territory
infarcts. No abnormal extra-axial fluid collections. Basal cisterns
are patent.

VASCULAR: Mild calcific atherosclerosis of the carotid siphons.
Tortuous dolichoectatic calcified RIGHT vertebral artery.

SKULL: No skull fracture. No significant scalp soft tissue swelling.

SINUSES/ORBITS: The mastoid air-cells and included paranasal sinuses
are well-aerated.The included ocular globes and orbital contents are
non-suspicious. Status post bilateral ocular lens implants.

OTHER: None.
IMPRESSION: 1. No acute intracranial process.
2. Re- demonstration of dolichoectatic calcified RIGHT vertebral
artery seen with chronic hypertension.
3. Otherwise negative noncontrast CT HEAD for age.
4. Critical Value/emergent results were called by telephone at the
time of interpretation on 03/13/2017 at [DATE] to Dr. KLERANT ROENA ,
who verbally acknowledged these results.

## 2018-11-14 IMAGING — CR DG CHEST 2V
2 series · 2 of 2 positions shown · non-contrast
Comparison: Chest radiograph dated 03/04/2010

CLINICAL DATA: 76-year-old female with numbness.

EXAM:
CHEST  2 VIEW

[w chest pa]
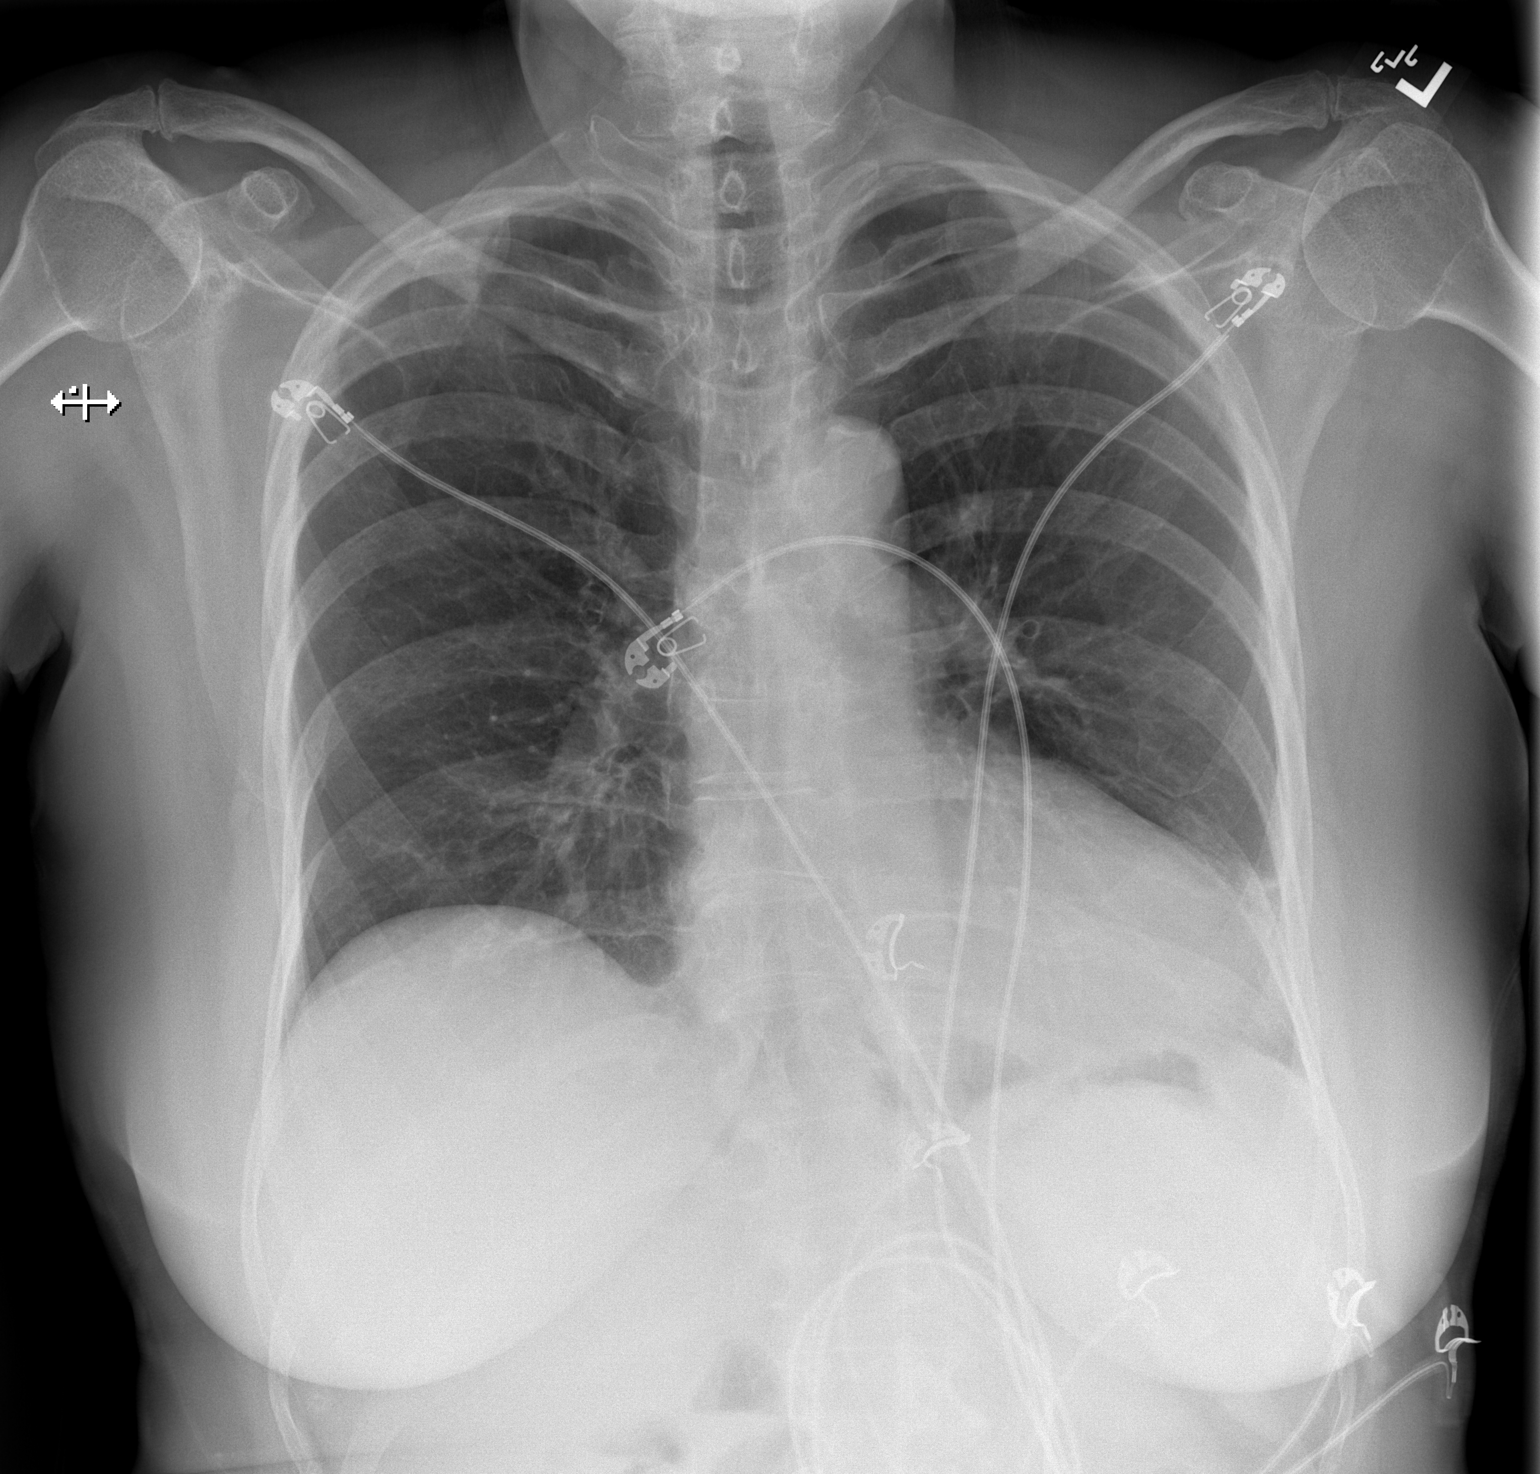

[w chest lat]
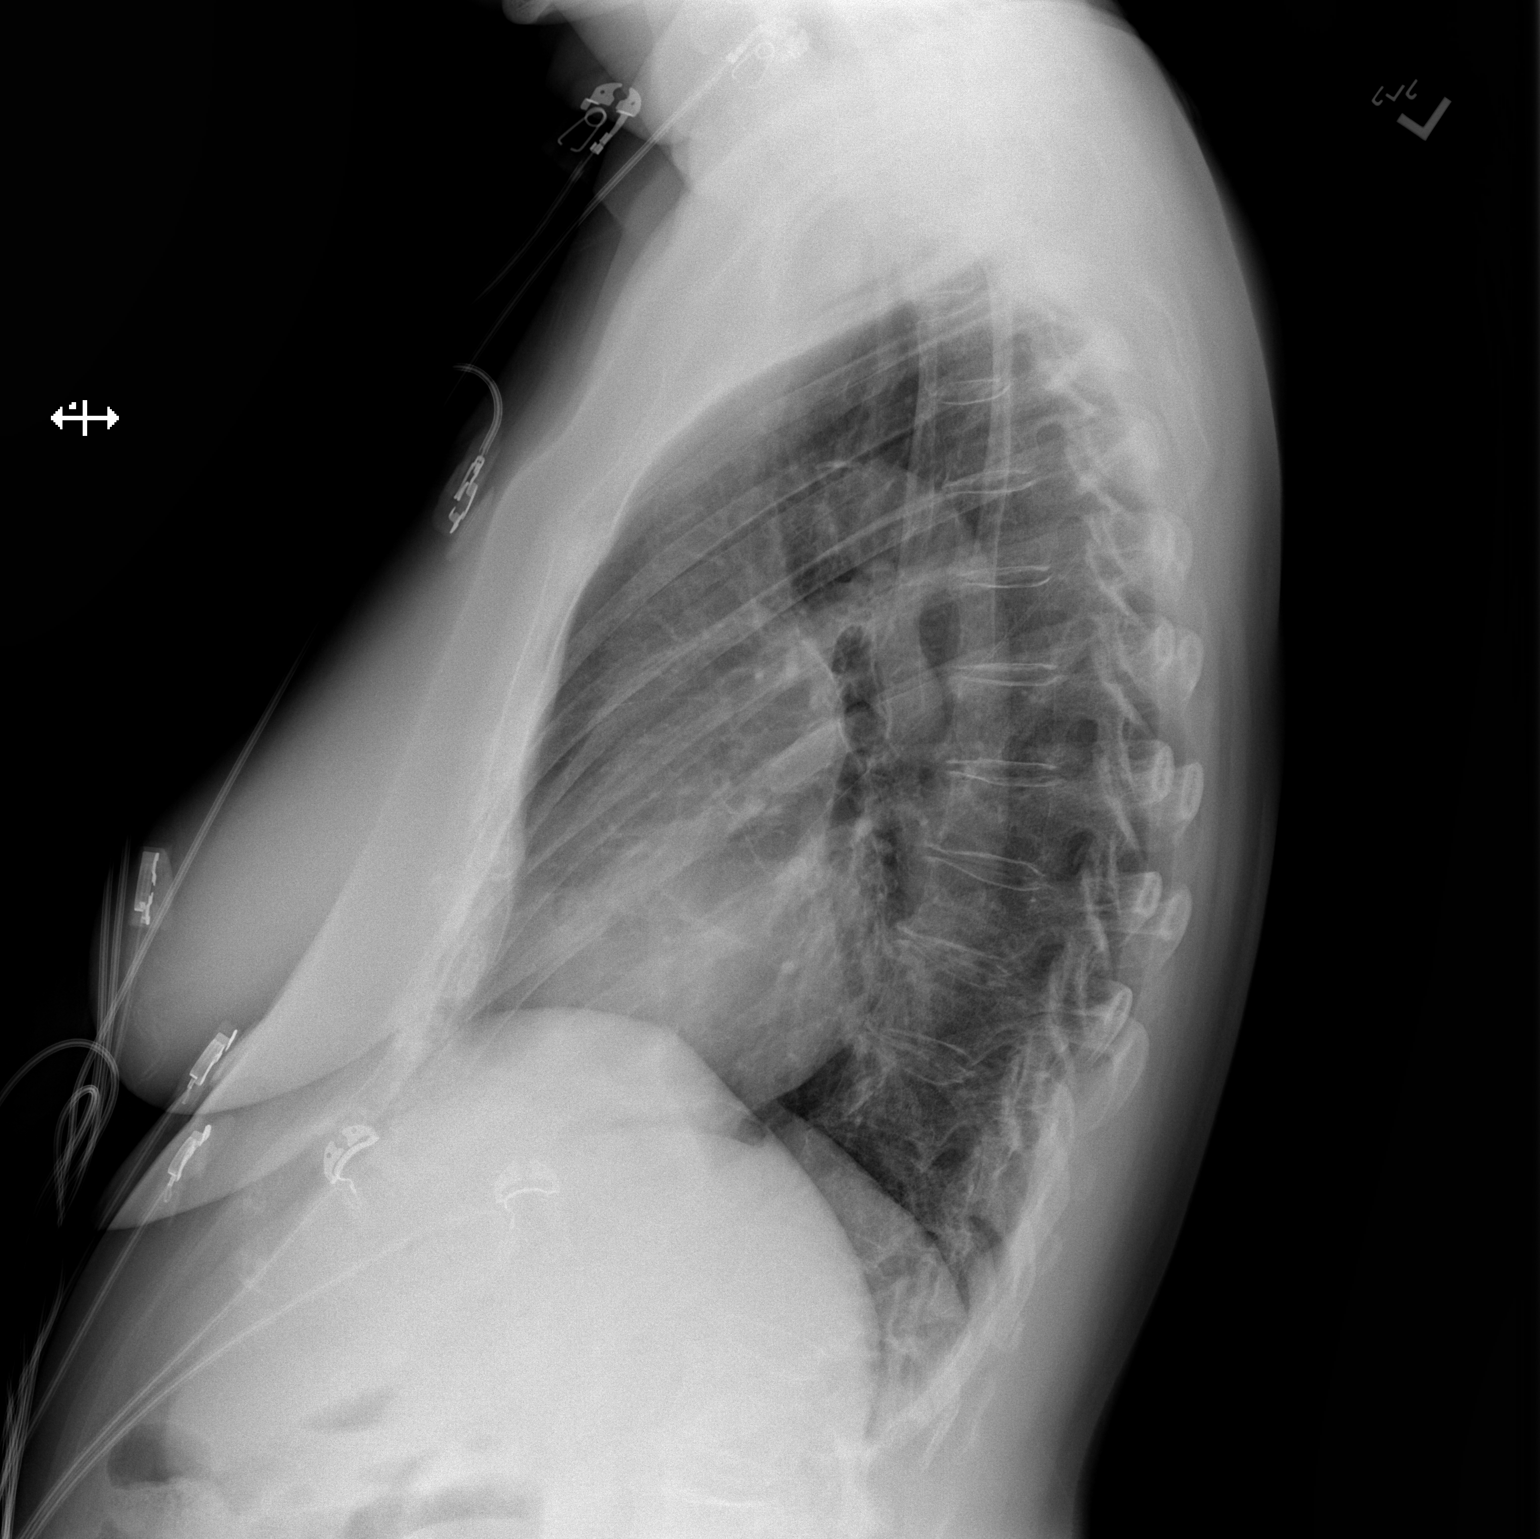

[2 of 2 positions shown; findings below may reference images not displayed]

FINDINGS: Stable borderline cardiomegaly. There is no focal consolidation,
pleural effusion, or pneumothorax. There is osteopenia with
degenerative changes of the spine. No acute osseous pathology.
IMPRESSION: No acute cardiopulmonary process.

Stable mild cardiomegaly.

## 2019-01-05 DIAGNOSIS — F3341 Major depressive disorder, recurrent, in partial remission: Secondary | ICD-10-CM | POA: Diagnosis not present

## 2019-01-24 DIAGNOSIS — J019 Acute sinusitis, unspecified: Secondary | ICD-10-CM | POA: Diagnosis not present

## 2019-01-24 DIAGNOSIS — J309 Allergic rhinitis, unspecified: Secondary | ICD-10-CM | POA: Diagnosis not present

## 2019-01-24 DIAGNOSIS — R0981 Nasal congestion: Secondary | ICD-10-CM | POA: Diagnosis not present

## 2019-02-22 DIAGNOSIS — F3341 Major depressive disorder, recurrent, in partial remission: Secondary | ICD-10-CM | POA: Diagnosis not present

## 2019-03-07 DIAGNOSIS — E782 Mixed hyperlipidemia: Secondary | ICD-10-CM | POA: Diagnosis not present

## 2019-03-07 DIAGNOSIS — G459 Transient cerebral ischemic attack, unspecified: Secondary | ICD-10-CM | POA: Diagnosis not present

## 2019-03-07 DIAGNOSIS — E785 Hyperlipidemia, unspecified: Secondary | ICD-10-CM | POA: Diagnosis not present

## 2019-03-07 DIAGNOSIS — F325 Major depressive disorder, single episode, in full remission: Secondary | ICD-10-CM | POA: Diagnosis not present

## 2019-03-15 DIAGNOSIS — E782 Mixed hyperlipidemia: Secondary | ICD-10-CM | POA: Diagnosis not present

## 2019-03-15 DIAGNOSIS — G459 Transient cerebral ischemic attack, unspecified: Secondary | ICD-10-CM | POA: Diagnosis not present

## 2019-03-15 DIAGNOSIS — E785 Hyperlipidemia, unspecified: Secondary | ICD-10-CM | POA: Diagnosis not present

## 2019-03-15 DIAGNOSIS — F325 Major depressive disorder, single episode, in full remission: Secondary | ICD-10-CM | POA: Diagnosis not present

## 2019-03-20 ENCOUNTER — Ambulatory Visit (INDEPENDENT_AMBULATORY_CARE_PROVIDER_SITE_OTHER): Payer: PPO | Admitting: Neurology

## 2019-03-20 ENCOUNTER — Other Ambulatory Visit: Payer: Self-pay

## 2019-03-20 ENCOUNTER — Encounter: Payer: Self-pay | Admitting: Neurology

## 2019-03-20 VITALS — BP 118/68 | HR 73 | Temp 97.7°F | Ht 68.0 in | Wt 153.3 lb

## 2019-03-20 DIAGNOSIS — R269 Unspecified abnormalities of gait and mobility: Secondary | ICD-10-CM

## 2019-03-20 DIAGNOSIS — R42 Dizziness and giddiness: Secondary | ICD-10-CM

## 2019-03-20 DIAGNOSIS — R413 Other amnesia: Secondary | ICD-10-CM

## 2019-03-20 MED ORDER — RIVASTIGMINE 4.6 MG/24HR TD PT24: 4.6000 mg | MEDICATED_PATCH | Freq: Every day | TRANSDERMAL | 3 refills | Status: DC

## 2019-03-20 NOTE — Progress Notes (Signed)
Reason for visit: Memory disorder, gait disorder, dizziness  Laura Pollard is an 78 y.o. female  History of present illness:  Laura Pollard is a 78 year old right-handed white female with a history of mild cognitive impairment, she is on the Exelon patch.  She has not been seen in about 15 months, she has had very little change in her memory over time.  She has not given up any activities of daily living because of memory.  She is tolerating the Exelon patch fairly well.  She is under a lot of stress taking care of her husband who has skin and bladder cancer and a dropped head syndrome.  The patient is on Prozac.  The patient continues to have a woozy feeling in the head, she will bump into walls on occasion but she has not had any falls, she does not use a cane or a walker for ambulation.  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  It is felt that this has some bearing on her sensation of dizziness.  Past Medical History:  Diagnosis Date  . Abnormal heart rhythm   . Anxiety   . Arthritis   . Atypical chest pain    normal ETT/Echo in 2003.. card cath normal 2010  . Concussion with loss of consciousness 04/27/2016  . Depression   . 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 Headache   . Hypercholesteremia   . Hyperlipidemia   . Liver disease   . Memory difficulties 02/05/2015  . Multiple allergies   . MVP (mitral valve prolapse)   . Nocturnal leg cramps 09/14/2014  . 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 Scoliosis     Past Surgical History:  Procedure Laterality Date  . CARDIAC CATHETERIZATION  2010   normal  . cataract surgery    . CESAREAN SECTION    . COLONOSCOPY    . ETT  2003   also ECHO..normal  . OVARIAN CYST SURGERY    . RETINAL DETACHMENT SURGERY Right 10 years ago  . TONSILLECTOMY      Family History  Problem Relation Age of Onset  . Heart disease Mother   . CAD Mother    . AAA (abdominal aortic aneurysm) Father   . Bladder Cancer Father   . Kidney cancer Sister        metastasis to lung  . HIV Son   . Breast cancer Maternal Aunt   . Breast cancer Cousin     Social history:  reports that she quit smoking about 52 years ago. She has never used smokeless tobacco. She reports current alcohol use. She reports that she does not use drugs.    Allergies  Allergen Reactions  . Gabapentin     Dizziness   . Indocin [Indomethacin] Other (See Comments)    Nausea and vomiting   . Keflex [Cephalexin] Other (See Comments)    unknown reaction   . Lipitor [Atorvastatin] Other (See Comments)    Muscle aches   . Pravastatin Other (See Comments)    Muscle aches   . Tape Rash    PAPER TAPE --- RASH    Medications:  Prior to Admission medications   Medication Sig Start Date End Date Taking? Authorizing Provider  ALPRAZolam Laura Pollard) 0.5 MG tablet Take 0.5 mg by mouth at bedtime as needed for anxiety.    [provider]  aspirin EC 81 MG EC tablet Take 1 tablet (81  mg total) by mouth daily. 03/14/17   Caren Griffins, MD  Calcium-Vitamin D-Vitamin K 500-100-40 MG-UNT-MCG CHEW Chew 1 tablet by mouth daily.    [provider]  cholecalciferol (VITAMIN D) 1000 UNITS tablet Take 2,000 Units by mouth daily.    [provider]  Coenzyme Q10 (CO Q 10 PO) Take 1 capsule by mouth daily.    [provider]  Cyanocobalamin 2500 MCG TABS Take 1 tablet by mouth daily.    [provider]  FLUoxetine (PROZAC) 20 MG tablet Take 20 mg by mouth daily.    [provider]  Glucosamine HCl 1500 MG TABS Take 1 tablet by mouth 2 (two) times daily.    [provider]  Omega 3 1200 MG CAPS Take 1 capsule by mouth daily.    [provider]  rivastigmine (EXELON) 4.6 mg/24hr PLACE 1 PATCH ONTO THE SKIN EVERY DAY 03/07/18   Kathrynn Ducking, MD  Turmeric Curcumin 500 MG CAPS Take 1 capsule by mouth daily.     [provider]    ROS:  Out of a complete 14 system review of symptoms, the patient complains only of the following symptoms, and all other reviewed systems are negative.  Dizziness Stress, anxiety Walking difficulty Memory problem  Blood pressure 118/68, pulse 73, temperature 97.7 F (36.5 C), temperature source Temporal, height 5\' 8"  (1.727 m), weight 153 lb 5 oz (69.5 kg).  Physical Exam  General: The patient is alert and cooperative at the time of the examination.  Skin: No significant peripheral edema is noted.   Neurologic Exam  Mental status: The patient is alert and oriented x 3 at the time of the examination. The Mini-Mental status examination done today shows a total score of 28/30.   Cranial nerves: Facial symmetry is present. Speech is normal, no aphasia or dysarthria is noted. Extraocular movements are full. Visual fields are full.  Motor: The patient has good strength in all 4 extremities.  Sensory examination: Soft touch sensation is symmetric on the face, arms, and legs.  Coordination: The patient has good finger-nose-finger and heel-to-shin bilaterally.  Gait and station: The patient has a normal gait. Tandem gait is unsteady.  Romberg is negative, but is unsteady. No drift is seen.  Reflexes: Deep tendon reflexes are symmetric.   Assessment/Plan:  1.  Mild cognitive impairment  2.  Right vertebral artery fusiform aneurysm, tortuosity, brainstem compression  3.  Dizziness, chronic  The patient is under a lot of stress, she has a high degree of anxiety currently.  We will continue the Exelon patch at this time.  A prescription was sent in.  The dizziness issue is chronic and likely untreatable.  She will follow-up here in 9 months, we will follow the memory issues over time.  The stress level is quite high, this likely also has a bearing on her cognitive function.  Greater than 50% of the visit was spent in counseling and coordination of  care.  Face-to-face time with the patient was 20 minutes.   Laura Alexanders MD 03/20/2019 8:30 AM  [facility] [address] [place], [place] [address]  Phone [phone] Fax [phone]

## 2019-04-17 DIAGNOSIS — F325 Major depressive disorder, single episode, in full remission: Secondary | ICD-10-CM | POA: Diagnosis not present

## 2019-04-17 DIAGNOSIS — E782 Mixed hyperlipidemia: Secondary | ICD-10-CM | POA: Diagnosis not present

## 2019-04-17 DIAGNOSIS — Z6823 Body mass index (BMI) 23.0-23.9, adult: Secondary | ICD-10-CM | POA: Diagnosis not present

## 2019-04-17 DIAGNOSIS — Z636 Dependent relative needing care at home: Secondary | ICD-10-CM | POA: Diagnosis not present

## 2019-04-17 DIAGNOSIS — F411 Generalized anxiety disorder: Secondary | ICD-10-CM | POA: Diagnosis not present

## 2019-04-17 DIAGNOSIS — Z0000 Encounter for general adult medical examination without abnormal findings: Secondary | ICD-10-CM | POA: Diagnosis not present

## 2019-04-17 DIAGNOSIS — R413 Other amnesia: Secondary | ICD-10-CM | POA: Diagnosis not present

## 2019-04-19 DIAGNOSIS — R519 Headache, unspecified: Secondary | ICD-10-CM | POA: Diagnosis not present

## 2019-04-19 DIAGNOSIS — Z6824 Body mass index (BMI) 24.0-24.9, adult: Secondary | ICD-10-CM | POA: Diagnosis not present

## 2019-04-19 NOTE — Patient Outreach (Signed)
Received a referral from Dr.  Sabra Heck Patient has [employer] insurance, This referral has been transferred to HTA CM through email address [email or web address] per workflow.

## 2019-04-25 ENCOUNTER — Encounter: Payer: Self-pay | Admitting: *Deleted

## 2019-04-25 ENCOUNTER — Other Ambulatory Visit: Payer: Self-pay | Admitting: *Deleted

## 2019-04-25 NOTE — Patient Outreach (Signed)
Laura Pollard) Care Management  04/25/2019  Laura Pollard 02-13-1941 [id number]  CSW was able to make initial contact with patient today to perform phone assessment, as well as assess and assist with social work needs and services.  CSW introduced self, explained role and types of services provided through [facility] ([facility]).  CSW further explained to patient that CSW works with patient's Primary Care Physician, also with [facility], Laura Pollard.  CSW then explained the reason for the call, indicating that Laura Pollard thought that patient, as well as patient's husband, Laura Pollard may benefit from social work services and resources to assist with arranging in-home care services versus long-term care placement into an assisted living facility.  CSW obtained two HIPAA compliant identifiers from patient, which included patient's name and date of birth.  Patient stated, "Oh no, Laura Pollard must have misunderstood me, my husband and I don't want to move out of our home, or be placed into a facility, we just need some help around the house".  Patient then proceeded to apologize to CSW for the misunderstanding.  CSW voiced understanding, agreeing to assist patient with arranging in-home care services, as well as provide patient with a list of in-home care agencies and resources.  CSW inquired as to whether or not patient thought that Laura Pollard would be willing to consider adult day care services, verbalizing patient's need for respite care from her husband, due to caregiver burden/stress.  Patient explained to CSW that she is having to assist Laura Pollard with all activities of daily living, but that she is able to perform ADL's independently.  Patient currently uses a walker to assist with ambulation.  Patient denied having any family members, friends, church members, neighbors, etc., that would be able to assist her and Laura Pollard in  the home.  Patient stated, "I really just need help with light housekeeping duties".  CSW explained to Laura Pollard that an in-home care worker typically provides assistance with bathing, dressing, meal preparation, grocery shopping, medication administration, transportation, laundry services, in addition to providing light housekeeping duties.  Patient admitted that she is concerned about how she will pay for these services out-of-pocket.  CSW inquired as to whether or not patient thought that she or Laura Pollard might qualify for Adult Medicaid, through the [facility], agreeing to assist with the application process.  CSW further explained to patient that if either of them are eligible for Adult Medicaid then they can receive PCS (Interior), through the [facility], or CAPS (Community Alternative Program Services), through the [facility], free of charge.  Patient reported that they would definitely not qualify for Adult Medicaid, receiving Social Security income that far exceeds the poverty guidelines for the [place], in addition to having too many assets (stocks, bonds, CD's, mutual funds, [place], Social research officer, government.).  Patient admitted that she is unsure as to whether or not [name] would even be agreeable to receiving in-home care services, "not wanting to receive assistance from a stranger".  Patient denies experiencing symptoms of depression and reported that her symptoms of anxiety are controlled through Xanax.   Patient encouraged CSW to mail her a packet of information, including transportation resources, as she indicated that she is currently able to drive herself and Laura Pollard to and from all their physician appointments, but would like this information  for future reference.  CSW agreed to mail all of the following information to patient today:  [facility] Ongoing Illnesses, Medical Condition or Disability in [place], [place] then agreed to follow-up with patient again next week, on Tuesday, May 02, 2019, around 10:00am, to ensure that she received the packet of resource information, answer any questions that she may have, assist with the referral process, as well as assess need for continued social work involvement.  Patient was agreeable to this plan, needing to terminate the call to take Laura Pollard to a doctors appointment.  CSW was able to ensure that patient has the correct contact information for CSW, encouraging patient to contact CSW directly if she changes her mind of if social work needs arise in the meantime.  CSW agreed to communicate this conversation with Laura Pollard, per patient's request, as well as apologize to Laura Pollard for the misunderstanding.  Laura Pollard, BSW, MSW, LCSW  Licensed Education officer, environmental Health System  Mailing [place] [address] [place], [place] [address] Physical Address-[address], [place], [place] [address] Toll Free Main # [phone] Fax # [phone] Cell # [phone]  Office # [phone] [email or web address]

## 2019-04-28 ENCOUNTER — Ambulatory Visit: Payer: Self-pay | Admitting: *Deleted

## 2019-05-02 ENCOUNTER — Other Ambulatory Visit: Payer: Self-pay | Admitting: *Deleted

## 2019-05-02 NOTE — Patient Outreach (Signed)
[facility]) Care Management  05/02/2019  Laura Pollard Jan 20, 1941 [id number]   CSW was able to make contact with patient today to follow-up regarding social work services and resources, as well as to ensure that patient received the packet of resource information mailed to her home by CSW.  Patient denied having received the packet of resources, but admitted to having problems with obtaining her mail.  Patient went on to say that she is not receiving her bills in a timely manner and that some of her mail has actually gotten lost lately.  CSW voiced understanding, explaining to patient that there may just be a delay with the postal service due to the holiday season.  CSW inquired about patient's anxiety level today, for which patient reported, "I believe I am managing my symptoms well".  Patient assured CSW that she takes all of her medications exactly as prescribed and that the new prescription for Prozac, coupled with Xanax, is helping her to better cope with caregiver stress.  Patient denies having any additional social work needs at this time.  CSW agreed to contact patient again next week, on Tuesday, May 09, 2019, around 10:00am, to confirm that she received the packet of resource information, as well as answer any questions that she may have.  Nat Christen, BSW, MSW, LCSW  Licensed Education officer, environmental Health System  Mailing [place] [address], [place], [place] [address] Physical Address-[address], [place], [place] [address] Toll Free Main # [phone] Fax # [phone] Cell # [phone]  Office # [phone] [email or web address]

## 2019-05-09 ENCOUNTER — Encounter: Payer: Self-pay | Admitting: *Deleted

## 2019-05-09 ENCOUNTER — Other Ambulatory Visit: Payer: Self-pay | Admitting: *Deleted

## 2019-05-09 NOTE — Patient Outreach (Signed)
[facility]) Care Management  05/09/2019  Laura Pollard Nov 04, 1940 [id number]   CSW was able to make contact with patient today to follow-up regarding social work services and resources, as well as to ensure that patient received the packet of resource information mailed to her home by CSW.  Patient admitted to receiving the packet and has already taken the opportunity to review the contents.  Patient indicated that she is now in the process of trying to narrow down her choices, from the extensive list of in-home care agencies provided to her, based on price and availability.  Patient denied needing assistance with the referral process.  CSW will perform a case closure on patient, as all goals of treatment have been met from social work standpoint and no additional social work needs have been identified at this time.  CSW will fax an update to patient's Primary Care Physician, [name] to ensure that she is aware of CSW's involvement with patient's plan of care.  CSW was able to confirm that patient has the correct contact information for CSW, encouraging patient to contact CSW directly if additional social work needs arise in the near future.    Laura Pollard, BSW, MSW, LCSW  Licensed Education officer, environmental Health System  Mailing [place] [address] [place], [place] [address] Physical Address-[address], [place],  [address] Toll Free Main # [phone] Fax # [phone] Cell # [phone]  Office # [phone] [email or web address]

## 2019-06-01 ENCOUNTER — Ambulatory Visit: Payer: PPO | Attending: Internal Medicine

## 2019-06-01 DIAGNOSIS — Z23 Encounter for immunization: Secondary | ICD-10-CM | POA: Insufficient documentation

## 2019-06-01 NOTE — Progress Notes (Signed)
   Covid-19 Vaccination Clinic  Name:  Laura Pollard    MRN: [id number] DOB: [birth year]  06/01/2019  [name] was observed post Covid-19 immunization for 15 minutes without incidence. She was provided with Vaccine Information Sheet and instruction to access the V-Safe system.   [name] was instructed to call 911 with any severe reactions post vaccine: Marland Kitchen Difficulty breathing  . Swelling of your face and throat  . A fast heartbeat  . A bad rash all over your body  . Dizziness and weakness    Immunizations Administered    Name Date Dose VIS Date Route   Pfizer COVID-19 Vaccine 06/01/2019  4:55 PM 0.3 mL 04/21/2019 Intramuscular   Manufacturer: [place]   Lot: [id number]   [place]: [id number]

## 2019-06-08 DIAGNOSIS — G459 Transient cerebral ischemic attack, unspecified: Secondary | ICD-10-CM | POA: Diagnosis not present

## 2019-06-08 DIAGNOSIS — F325 Major depressive disorder, single episode, in full remission: Secondary | ICD-10-CM | POA: Diagnosis not present

## 2019-06-08 DIAGNOSIS — E785 Hyperlipidemia, unspecified: Secondary | ICD-10-CM | POA: Diagnosis not present

## 2019-06-08 DIAGNOSIS — E782 Mixed hyperlipidemia: Secondary | ICD-10-CM | POA: Diagnosis not present

## 2019-06-22 ENCOUNTER — Ambulatory Visit: Payer: PPO | Attending: Internal Medicine

## 2019-06-22 DIAGNOSIS — Z23 Encounter for immunization: Secondary | ICD-10-CM | POA: Insufficient documentation

## 2019-06-22 NOTE — Progress Notes (Signed)
   Covid-19 Vaccination Clinic  Name:  Laura Pollard    MRN: [id number] DOB: [birth year]  06/22/2019  Ms. Pool was observed post Covid-19 immunization for 15 minutes without incidence. She was provided with Vaccine Information Sheet and instruction to access the V-Safe system.   [name] was instructed to call 911 with any severe reactions post vaccine: Marland Kitchen Difficulty breathing  . Swelling of your face and throat  . A fast heartbeat  . A bad rash all over your body  . Dizziness and weakness    Immunizations Administered    Name Date Dose VIS Date Route   Pfizer COVID-19 Vaccine 06/22/2019 11:24 AM 0.3 mL 04/21/2019 Intramuscular   Manufacturer: [employer], [employer]   Lot: [id number]   [place]: [id number]

## 2019-07-03 ENCOUNTER — Other Ambulatory Visit: Payer: Self-pay | Admitting: Family Medicine

## 2019-07-03 DIAGNOSIS — Z1231 Encounter for screening mammogram for malignant neoplasm of breast: Secondary | ICD-10-CM

## 2019-07-14 DIAGNOSIS — E782 Mixed hyperlipidemia: Secondary | ICD-10-CM | POA: Diagnosis not present

## 2019-07-14 DIAGNOSIS — F325 Major depressive disorder, single episode, in full remission: Secondary | ICD-10-CM | POA: Diagnosis not present

## 2019-07-14 DIAGNOSIS — E785 Hyperlipidemia, unspecified: Secondary | ICD-10-CM | POA: Diagnosis not present

## 2019-07-14 DIAGNOSIS — G459 Transient cerebral ischemic attack, unspecified: Secondary | ICD-10-CM | POA: Diagnosis not present

## 2019-07-19 DIAGNOSIS — K5900 Constipation, unspecified: Secondary | ICD-10-CM | POA: Diagnosis not present

## 2019-07-19 DIAGNOSIS — R197 Diarrhea, unspecified: Secondary | ICD-10-CM | POA: Diagnosis not present

## 2019-07-28 DIAGNOSIS — E785 Hyperlipidemia, unspecified: Secondary | ICD-10-CM | POA: Diagnosis not present

## 2019-07-28 DIAGNOSIS — I10 Essential (primary) hypertension: Secondary | ICD-10-CM | POA: Diagnosis not present

## 2019-07-28 DIAGNOSIS — F331 Major depressive disorder, recurrent, moderate: Secondary | ICD-10-CM | POA: Diagnosis not present

## 2019-07-28 DIAGNOSIS — Z87891 Personal history of nicotine dependence: Secondary | ICD-10-CM | POA: Diagnosis not present

## 2019-07-28 DIAGNOSIS — F0390 Unspecified dementia without behavioral disturbance: Secondary | ICD-10-CM | POA: Diagnosis not present

## 2019-07-28 DIAGNOSIS — Z6823 Body mass index (BMI) 23.0-23.9, adult: Secondary | ICD-10-CM | POA: Diagnosis not present

## 2019-08-03 DIAGNOSIS — M79645 Pain in left finger(s): Secondary | ICD-10-CM | POA: Diagnosis not present

## 2019-08-07 DIAGNOSIS — F3341 Major depressive disorder, recurrent, in partial remission: Secondary | ICD-10-CM | POA: Diagnosis not present

## 2019-08-11 ENCOUNTER — Other Ambulatory Visit: Payer: Self-pay

## 2019-08-11 ENCOUNTER — Ambulatory Visit
Admission: RE | Admit: 2019-08-11 | Discharge: 2019-08-11 | Disposition: A | Discharge status: Home / Self Care | Payer: PPO | Source: Ambulatory Visit | Attending: Family Medicine | Admitting: Family Medicine

## 2019-08-11 DIAGNOSIS — Z1231 Encounter for screening mammogram for malignant neoplasm of breast: Secondary | ICD-10-CM | POA: Diagnosis not present

## 2019-09-15 DIAGNOSIS — L298 Other pruritus: Secondary | ICD-10-CM | POA: Diagnosis not present

## 2019-09-15 DIAGNOSIS — L821 Other seborrheic keratosis: Secondary | ICD-10-CM | POA: Diagnosis not present

## 2019-09-20 DIAGNOSIS — G459 Transient cerebral ischemic attack, unspecified: Secondary | ICD-10-CM | POA: Diagnosis not present

## 2019-09-20 DIAGNOSIS — E785 Hyperlipidemia, unspecified: Secondary | ICD-10-CM | POA: Diagnosis not present

## 2019-09-20 DIAGNOSIS — E782 Mixed hyperlipidemia: Secondary | ICD-10-CM | POA: Diagnosis not present

## 2019-09-20 DIAGNOSIS — F325 Major depressive disorder, single episode, in full remission: Secondary | ICD-10-CM | POA: Diagnosis not present

## 2019-09-25 DIAGNOSIS — F3341 Major depressive disorder, recurrent, in partial remission: Secondary | ICD-10-CM | POA: Diagnosis not present

## 2019-10-11 DIAGNOSIS — G459 Transient cerebral ischemic attack, unspecified: Secondary | ICD-10-CM | POA: Diagnosis not present

## 2019-10-11 DIAGNOSIS — F325 Major depressive disorder, single episode, in full remission: Secondary | ICD-10-CM | POA: Diagnosis not present

## 2019-10-11 DIAGNOSIS — E782 Mixed hyperlipidemia: Secondary | ICD-10-CM | POA: Diagnosis not present

## 2019-10-11 DIAGNOSIS — E785 Hyperlipidemia, unspecified: Secondary | ICD-10-CM | POA: Diagnosis not present

## 2019-10-17 DIAGNOSIS — S6992XD Unspecified injury of left wrist, hand and finger(s), subsequent encounter: Secondary | ICD-10-CM | POA: Diagnosis not present

## 2019-10-17 DIAGNOSIS — E559 Vitamin D deficiency, unspecified: Secondary | ICD-10-CM | POA: Diagnosis not present

## 2019-10-17 DIAGNOSIS — E782 Mixed hyperlipidemia: Secondary | ICD-10-CM | POA: Diagnosis not present

## 2019-10-17 DIAGNOSIS — R5383 Other fatigue: Secondary | ICD-10-CM | POA: Diagnosis not present

## 2019-10-17 DIAGNOSIS — Z6823 Body mass index (BMI) 23.0-23.9, adult: Secondary | ICD-10-CM | POA: Diagnosis not present

## 2019-10-24 DIAGNOSIS — S60451A Superficial foreign body of left index finger, initial encounter: Secondary | ICD-10-CM | POA: Diagnosis not present

## 2019-10-24 DIAGNOSIS — M79645 Pain in left finger(s): Secondary | ICD-10-CM | POA: Diagnosis not present

## 2019-11-14 ENCOUNTER — Emergency Department (HOSPITAL_COMMUNITY): Payer: PPO

## 2019-11-14 ENCOUNTER — Encounter (HOSPITAL_COMMUNITY): Payer: Self-pay | Admitting: Emergency Medicine

## 2019-11-14 ENCOUNTER — Other Ambulatory Visit: Payer: Self-pay

## 2019-11-14 ENCOUNTER — Emergency Department (HOSPITAL_COMMUNITY)
Admission: EM | Admit: 2019-11-14 | Discharge: 2019-11-14 | Disposition: A | Discharge status: Home / Self Care | Payer: PPO | Attending: Emergency Medicine | Admitting: Emergency Medicine

## 2019-11-14 DIAGNOSIS — R69 Illness, unspecified: Secondary | ICD-10-CM | POA: Diagnosis not present

## 2019-11-14 DIAGNOSIS — R112 Nausea with vomiting, unspecified: Secondary | ICD-10-CM | POA: Insufficient documentation

## 2019-11-14 DIAGNOSIS — Z7982 Long term (current) use of aspirin: Secondary | ICD-10-CM | POA: Insufficient documentation

## 2019-11-14 DIAGNOSIS — R9082 White matter disease, unspecified: Secondary | ICD-10-CM | POA: Diagnosis not present

## 2019-11-14 DIAGNOSIS — Z79899 Other long term (current) drug therapy: Secondary | ICD-10-CM | POA: Diagnosis not present

## 2019-11-14 DIAGNOSIS — G450 Vertebro-basilar artery syndrome: Secondary | ICD-10-CM | POA: Diagnosis not present

## 2019-11-14 DIAGNOSIS — Z87891 Personal history of nicotine dependence: Secondary | ICD-10-CM | POA: Diagnosis not present

## 2019-11-14 DIAGNOSIS — G9389 Other specified disorders of brain: Secondary | ICD-10-CM | POA: Diagnosis not present

## 2019-11-14 DIAGNOSIS — R519 Headache, unspecified: Secondary | ICD-10-CM | POA: Insufficient documentation

## 2019-11-14 DIAGNOSIS — S0990XA Unspecified injury of head, initial encounter: Secondary | ICD-10-CM | POA: Diagnosis not present

## 2019-11-14 LAB — COMPREHENSIVE METABOLIC PANEL
ALT: 44 U/L (ref 0–44)
AST: 38 U/L (ref 15–41)
Albumin: 5.4 g/dL — ABNORMAL HIGH (ref 3.5–5.0)
Alkaline Phosphatase: 81 U/L (ref 38–126)
Anion gap: 15 (ref 5–15)
BUN: 16 mg/dL (ref 8–23)
CO2: 25 mmol/L (ref 22–32)
Calcium: 10.1 mg/dL (ref 8.9–10.3)
Chloride: 101 mmol/L (ref 98–111)
Creatinine, Ser: 0.71 mg/dL (ref 0.44–1.00)
GFR calc Af Amer: >60 mL/min (ref >60)
GFR calc non Af Amer: >60 mL/min (ref >60)
Glucose, Bld: 149 mg/dL — ABNORMAL HIGH (ref 70–99)
Potassium: 3.9 mmol/L (ref 3.5–5.1)
Sodium: 141 mmol/L (ref 135–145)
Total Bilirubin: 1.6 mg/dL — ABNORMAL HIGH (ref 0.3–1.2)
Total Protein: 8.7 g/dL — ABNORMAL HIGH (ref 6.5–8.1)

## 2019-11-14 LAB — ETHANOL: Alcohol, Ethyl (B): <10 mg/dL

## 2019-11-14 LAB — URINALYSIS, ROUTINE W REFLEX MICROSCOPIC
Bilirubin Urine: NEGATIVE
Glucose, UA: NEGATIVE mg/dL
Hgb urine dipstick: NEGATIVE
Ketones, ur: 5 mg/dL — AB
Leukocytes,Ua: NEGATIVE
Nitrite: POSITIVE — AB
Protein, ur: 30 mg/dL — AB
Specific Gravity, Urine: 1.023 (ref 1.005–1.030)
pH: 5 (ref 5.0–8.0)

## 2019-11-14 LAB — DIFFERENTIAL
Abs Immature Granulocytes: 0.02 K/uL (ref 0.00–0.07)
Basophils Absolute: 0 K/uL (ref 0.0–0.1)
Basophils Relative: 0 %
Eosinophils Absolute: 0 K/uL (ref 0.0–0.5)
Eosinophils Relative: 0 %
Immature Granulocytes: 0 %
Lymphocytes Relative: 11 %
Lymphs Abs: 0.8 K/uL (ref 0.7–4.0)
Monocytes Absolute: 0.2 K/uL (ref 0.1–1.0)
Monocytes Relative: 3 %
Neutro Abs: 6 K/uL (ref 1.7–7.7)
Neutrophils Relative %: 86 %

## 2019-11-14 LAB — RAPID URINE DRUG SCREEN, HOSP PERFORMED
Amphetamines: NOT DETECTED
Barbiturates: NOT DETECTED
Benzodiazepines: POSITIVE — AB
Cocaine: NOT DETECTED
Opiates: NOT DETECTED
Tetrahydrocannabinol: NOT DETECTED

## 2019-11-14 LAB — CBC
HCT: 44.7 % (ref 36.0–46.0)
Hemoglobin: 14.7 g/dL (ref 12.0–15.0)
MCH: 29.6 pg (ref 26.0–34.0)
MCHC: 32.9 g/dL (ref 30.0–36.0)
MCV: 90.1 fL (ref 80.0–100.0)
Platelets: 177 K/uL (ref 150–400)
RBC: 4.96 MIL/uL (ref 3.87–5.11)
RDW: 14 % (ref 11.5–15.5)
WBC: 7.1 K/uL (ref 4.0–10.5)
nRBC: 0 % (ref 0.0–0.2)

## 2019-11-14 LAB — I-STAT CHEM 8, ED
BUN: 16 mg/dL (ref 8–23)
Calcium, Ion: 1.14 mmol/L — ABNORMAL LOW (ref 1.15–1.40)
Chloride: 102 mmol/L (ref 98–111)
Creatinine, Ser: 0.6 mg/dL (ref 0.44–1.00)
Glucose, Bld: 148 mg/dL — ABNORMAL HIGH (ref 70–99)
HCT: 44 % (ref 36.0–46.0)
Hemoglobin: 15 g/dL (ref 12.0–15.0)
Potassium: 3.9 mmol/L (ref 3.5–5.1)
Sodium: 143 mmol/L (ref 135–145)
TCO2: 27 mmol/L (ref 22–32)

## 2019-11-14 LAB — TROPONIN I (HIGH SENSITIVITY): Troponin I (High Sensitivity): 3 ng/L (ref <18)

## 2019-11-14 LAB — APTT: aPTT: 26 seconds (ref 24–36)

## 2019-11-14 LAB — PROTIME-INR
INR: 1 (ref 0.8–1.2)
Prothrombin Time: 13.2 seconds (ref 11.4–15.2)

## 2019-11-14 MED ORDER — FOSFOMYCIN TROMETHAMINE 3 G PO PACK
3.0000 g | PACK | Freq: Once | ORAL | Status: AC
  Administered 2019-11-14: 3 g via ORAL
  Filled 2019-11-14: qty 3

## 2019-11-14 MED ORDER — PROCHLORPERAZINE EDISYLATE 10 MG/2ML IJ SOLN
5.0000 mg | Freq: Once | INTRAMUSCULAR | Status: AC
  Administered 2019-11-14: 5 mg via INTRAVENOUS
  Filled 2019-11-14: qty 2

## 2019-11-14 MED ORDER — ONDANSETRON 4 MG PO TBDP
ORAL_TABLET | ORAL | 0 refills | Status: DC
Start: 2019-11-14 — End: 2019-12-18

## 2019-11-14 MED ORDER — DIPHENHYDRAMINE HCL 50 MG/ML IJ SOLN
12.5000 mg | Freq: Once | INTRAMUSCULAR | Status: AC
  Administered 2019-11-14: 12.5 mg via INTRAVENOUS
  Filled 2019-11-14: qty 1

## 2019-11-14 NOTE — Discharge Instructions (Signed)
Return for worsening. Follow up with your family doc.

## 2019-11-14 NOTE — ED Provider Notes (Signed)
[facility] DEPT Provider Note   CSN: [id number] Arrival date & time: 11/14/19  1019     History Chief Complaint  Patient presents with  . congestion/post nasal drip    Laura Pollard is a 79 y.o. female.  79 yo F with a chief complaints of nausea and vomiting.  Is been going on for a couple days.  The patient had a fall about 10 days ago and she thinks it somehow related.  Had some abrasions initially that have healed.  Denied neck pain denied one-sided numbness or weakness no difficulty with speech or swallowing.  Feeling mildly unsteady for the past couple days.  Denies fevers denies cough or congestion denies diarrhea.  Denies abdominal pain.  The history is provided by the patient.  Illness Severity:  Moderate Onset quality:  Gradual Duration:  1 week Timing:  Constant Progression:  Worsening Chronicity:  New Associated symptoms: headaches (off and on), nausea and vomiting   Associated symptoms: no chest pain, no congestion, no fever, no myalgias, no rhinorrhea, no shortness of breath and no wheezing        Past Medical History:  Diagnosis Date  . Abnormal heart rhythm   . Anxiety   . Arthritis   . Atypical chest pain    normal ETT/Echo in 2003.. card cath normal 2010  . Concussion with loss of consciousness 04/27/2016  . Depression   . 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 Headache   . Hypercholesteremia   . Hyperlipidemia   . Liver disease   . Memory difficulties 02/05/2015  . Multiple allergies   . MVP (mitral valve prolapse)   . Nocturnal leg cramps 09/14/2014  . 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 Scoliosis     Patient Active Problem List   Diagnosis Date Noted  . TIA (transient ischemic attack) 03/13/2017  . Anxiety   . Trigeminal neuralgia   . 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 Concussion with loss of consciousness 04/27/2016  . 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 Memory difficulties 02/05/2015  . Gait disorder  09/14/2014  . Dizziness and giddiness 09/14/2014  . Nocturnal leg cramps 09/14/2014    Past Surgical History:  Procedure Laterality Date  . CARDIAC CATHETERIZATION  2010   normal  . cataract surgery    . CESAREAN SECTION    . COLONOSCOPY    . ETT  2003   also ECHO..normal  . OVARIAN CYST SURGERY    . RETINAL DETACHMENT SURGERY Right 10 years ago  . TONSILLECTOMY       OB History   No obstetric history on file.     Family History  Problem Relation Age of Onset  . Heart disease Mother   . CAD Mother   . AAA (abdominal aortic aneurysm) Father   . Bladder Cancer Father   . Kidney cancer Sister        metastasis to lung  . HIV Son   . Breast cancer Maternal Aunt   . Breast cancer Cousin     Social History   Tobacco Use  . Smoking status: Former Smoker    Quit date: 05/11/1966    Years since quitting: 53.5  . Smokeless tobacco: Never Used  Substance Use Topics  . Alcohol use: Yes    Alcohol/week: 0.0 standard drinks    Comment: rarely  . Drug use: No    Home Medications Prior to Admission medications   Medication Sig Start Date End Date Taking? Authorizing Provider  atorvastatin (LIPITOR)  10 MG tablet Take 10 mg by mouth daily. 09/04/19  Yes [provider]  cholecalciferol (VITAMIN D) 1000 UNITS tablet Take 2,000 Units by mouth daily.   Yes [provider]  FLUoxetine (PROZAC) 20 MG tablet Take 20 mg by mouth daily.   Yes [provider]  rivastigmine (EXELON) 4.6 mg/24hr Place 1 patch (4.6 mg total) onto the skin daily. 03/20/19  Yes Kathrynn Ducking, MD  aspirin EC 81 MG EC tablet Take 1 tablet (81 mg total) by mouth daily. Patient not taking: Reported on 03/20/2019 03/14/17   Caren Griffins, MD  ondansetron (ZOFRAN ODT) 4 MG disintegrating tablet 4mg  ODT q4 hours prn nausea/vomit 11/14/19   Deno Etienne, DO    Allergies    Gabapentin, Indocin [indomethacin], Keflex [cephalexin], Lipitor [atorvastatin], Pravastatin, and Tape  Review  of Systems   Review of Systems  Constitutional: Negative for chills and fever.  HENT: Negative for congestion and rhinorrhea.   Eyes: Negative for redness and visual disturbance.  Respiratory: Negative for shortness of breath and wheezing.   Cardiovascular: Negative for chest pain and palpitations.  Gastrointestinal: Positive for nausea and vomiting.  Genitourinary: Negative for dysuria and urgency.  Musculoskeletal: Negative for arthralgias and myalgias.  Skin: Negative for pallor and wound.  Neurological: Positive for headaches (off and on). Negative for dizziness.    Physical Exam Updated Vital Signs BP (!) 159/87   Pulse 68   Temp 98 F (36.7 C) (Oral)   Resp (!) 21   Ht 5\' 8"  (1.727 m)   Wt 68 kg   SpO2 96%   BMI 22.81 kg/m   Physical Exam Vitals and nursing note reviewed.  Constitutional:      General: She is not in acute distress.    Appearance: She is well-developed. She is not diaphoretic.  HENT:     Head: Normocephalic and atraumatic.  Eyes:     Pupils: Pupils are equal, round, and reactive to light.  Cardiovascular:     Rate and Rhythm: Normal rate and regular rhythm.     Heart sounds: No murmur heard.  No friction rub. No gallop.   Pulmonary:     Effort: Pulmonary effort is normal.     Breath sounds: No wheezing or rales.  Abdominal:     General: There is no distension.     Palpations: Abdomen is soft.     Tenderness: There is no abdominal tenderness.  Musculoskeletal:        General: No tenderness.     Cervical back: Normal range of motion and neck supple.  Skin:    General: Skin is warm and dry.  Neurological:     Mental Status: She is alert and oriented to person, place, and time.     GCS: GCS eye subscore is 4. GCS verbal subscore is 5. GCS motor subscore is 6.     Cranial Nerves: Cranial nerves are intact.     Sensory: Sensation is intact.     Motor: Tremor (rue) present.     Coordination: Finger-Nose-Finger Test abnormal.     Gait: Gait is  intact.     Comments: Some wavering with finger-to-nose with the right upper extremity compared to the left.  Otherwise no focal weakness able to ambulate without assistance  Psychiatric:        Behavior: Behavior normal.     ED Results / Procedures / Treatments   Labs (all labs ordered are listed, but only abnormal results are displayed) Labs  Reviewed  COMPREHENSIVE METABOLIC PANEL - Abnormal; Notable for the following components:      Result Value   Glucose, Bld 149 (*)    Total Protein 8.7 (*)    Albumin 5.4 (*)    Total Bilirubin 1.6 (*)    All other components within normal limits  RAPID URINE DRUG SCREEN, HOSP PERFORMED - Abnormal; Notable for the following components:   Benzodiazepines POSITIVE (*)    All other components within normal limits  URINALYSIS, ROUTINE W REFLEX MICROSCOPIC - Abnormal; Notable for the following components:   Color, Urine AMBER (*)    APPearance HAZY (*)    Ketones, ur 5 (*)    Protein, ur 30 (*)    Nitrite POSITIVE (*)    Bacteria, UA MANY (*)    All other components within normal limits  I-STAT CHEM 8, ED - Abnormal; Notable for the following components:   Glucose, Bld 148 (*)    Calcium, Ion 1.14 (*)    All other components within normal limits  ETHANOL  PROTIME-INR  APTT  CBC  DIFFERENTIAL  TROPONIN I (HIGH SENSITIVITY)    EKG EKG Interpretation  Date/Time:  Tuesday November 14 2019 11:37:29 EDT Ventricular Rate:  73 PR Interval:    QRS Duration: 94 QT Interval:  391 QTC Calculation: 431 R Axis:   -2 Text Interpretation: Sinus rhythm Nonspecific T abnormalities, diffuse leads diffuse st segment depression, seen on prior though more pronounced today Otherwise no significant change Confirmed by Deno Etienne [phone]) on 11/14/2019 11:50:49 AM   Radiology CT HEAD WO CONTRAST  Result Date: 11/14/2019 CLINICAL DATA:  Headache related to fall 10 days ago EXAM: CT HEAD WITHOUT CONTRAST TECHNIQUE: Contiguous axial images were obtained from  the base of the skull through the vertex without intravenous contrast. COMPARISON:  03/13/2017 FINDINGS: Brain: No evidence of acute infarction, hemorrhage, hydrocephalus, extra-axial collection or mass lesion/mass effect. Vascular: Vertebrobasilar dolichoectasia and prominent atherosclerotic calcification. There is associated chronic mass effect on the medulla. Skull: Normal. Negative for fracture or focal lesion. Sinuses/Orbits: Bilateral cataract resection IMPRESSION: No acute finding or evidence of injury. Electronically Signed   By: Monte Fantasia M.D.   On: 11/14/2019 11:51   MR BRAIN WO CONTRAST  Result Date: 11/14/2019 CLINICAL DATA:  Ataxia.  Vomiting.  Headache.  Fell 10 days ago. EXAM: MRI HEAD WITHOUT CONTRAST TECHNIQUE: Multiplanar, multiecho pulse sequences of the brain and surrounding structures were obtained without intravenous contrast. COMPARISON:  Head CT same day.  MRI 03/13/2017 FINDINGS: Brain: Diffusion imaging does not show any acute or subacute infarction. Dolichoectasia of the right vertebral artery indents the pontomedullary junction region. No evidence of gliosis or edema. Elsewhere, cerebral hemispheres show age related volume loss with mild chronic small-vessel change of the hemispheric white matter. No cortical or large vessel territory infarction. No mass lesion, hemorrhage, hydrocephalus or extra-axial collection. Vascular: Major vessels at the base of the brain show flow. As noted above, dolichoectasia of the right vertebral artery. Skull and upper cervical spine: Negative Sinuses/Orbits: Clear/normal Other: None IMPRESSION: No change since November 2018. Mild age related volume loss and chronic small-vessel change of the cerebral hemispheric white matter. 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 Electronically Signed   By: Nelson Chimes M.D.   On: 11/14/2019 14:50     Procedures Procedures (including critical care time)  Medications Ordered in ED Medications  fosfomycin (MONUROL) packet 3 g (  has no administration in time range)  prochlorperazine (COMPAZINE) injection 5 mg (5 mg Intravenous Given 11/14/19 1134)  diphenhydrAMINE (BENADRYL) injection 12.5 mg (12.5 mg Intravenous Given 11/14/19 1134)    ED Course  I have reviewed the triage vital signs and the nursing notes.  Pertinent labs & imaging results that were available during my care of the patient were reviewed by me and considered in my medical decision making (see chart for details).    MDM Rules/Calculators/A&P                          79 yo F with a chief complaint of nausea and headache.  This is after she had a head injury about 10 days ago.  Seems less likely to be an intracranial hemorrhage based on history.  CT scan with no obvious finding.  She does have a wavering finger-to-nose with the right upper extremity and she is right-handed.  Will obtain an MRI.  MRI is negative.  Patient is feeling much better after headache cocktail.  Lab work is without leukocytosis no significant electrolyte abnormality.  Renal function is baseline.  UA with possible infection though no symptoms.    3:00 PM:  I have discussed the diagnosis/risks/treatment options with the patient and believe the pt to be eligible for discharge home to follow-up with PCP. We also discussed returning to the ED immediately if new or worsening sx occur. We discussed the sx which are most concerning (e.g., sudden worsening pain, fever, inability to tolerate by mouth) that necessitate immediate return. Medications administered to the patient during their visit and any new prescriptions provided to the patient are listed below.  Medications given during this visit Medications  fosfomycin (MONUROL) packet 3 g (has no administration in time range)  prochlorperazine (COMPAZINE) injection 5 mg (5 mg Intravenous Given 11/14/19 1134)   diphenhydrAMINE (BENADRYL) injection 12.5 mg (12.5 mg Intravenous Given 11/14/19 1134)     The patient appears reasonably screen and/or stabilized for discharge and I doubt any other medical condition or other [facility] requiring further screening, evaluation, or treatment in the ED at this time prior to discharge.   Final Clinical Impression(s) / ED Diagnoses Final diagnoses:  Nausea and vomiting in adult    Rx / DC Orders ED Discharge Orders         Ordered    ondansetron (ZOFRAN ODT) 4 MG disintegrating tablet     Discontinue  Reprint     11/14/19 [place], Prashant Glosser, DO 11/14/19 1500

## 2019-11-14 NOTE — ED Notes (Signed)
Pt to CT

## 2019-11-14 NOTE — Progress Notes (Signed)
Failed attempt at MRI. Patient refused MRI at this time. Requesting to speak to MD first.

## 2019-11-14 NOTE — ED Triage Notes (Signed)
Per pt, states she has been having post nasal drip which is causing her to vomit-states she is having 4/10 head pain-recently had a fall 10 days ago

## 2019-11-14 NOTE — ED Notes (Signed)
ED Provider at bedside. 

## 2019-11-14 NOTE — ED Notes (Signed)
Pt to MRI

## 2019-11-15 DIAGNOSIS — R112 Nausea with vomiting, unspecified: Secondary | ICD-10-CM | POA: Diagnosis not present

## 2019-11-15 DIAGNOSIS — N390 Urinary tract infection, site not specified: Secondary | ICD-10-CM | POA: Diagnosis not present

## 2019-11-20 ENCOUNTER — Other Ambulatory Visit: Payer: Self-pay | Admitting: Family Medicine

## 2019-11-20 DIAGNOSIS — R109 Unspecified abdominal pain: Secondary | ICD-10-CM

## 2019-11-20 DIAGNOSIS — R82998 Other abnormal findings in urine: Secondary | ICD-10-CM

## 2019-11-23 ENCOUNTER — Ambulatory Visit
Admission: RE | Admit: 2019-11-23 | Discharge: 2019-11-23 | Disposition: A | Discharge status: Home / Self Care | Payer: PPO | Source: Ambulatory Visit | Attending: Family Medicine | Admitting: Family Medicine

## 2019-11-23 ENCOUNTER — Other Ambulatory Visit: Payer: Self-pay

## 2019-11-23 DIAGNOSIS — K8689 Other specified diseases of pancreas: Secondary | ICD-10-CM | POA: Diagnosis not present

## 2019-11-23 DIAGNOSIS — R109 Unspecified abdominal pain: Secondary | ICD-10-CM

## 2019-11-23 DIAGNOSIS — R82998 Other abnormal findings in urine: Secondary | ICD-10-CM

## 2019-12-18 ENCOUNTER — Encounter: Payer: Self-pay | Admitting: Neurology

## 2019-12-18 ENCOUNTER — Ambulatory Visit: Payer: PPO | Admitting: Neurology

## 2019-12-18 VITALS — BP 121/66 | HR 76 | Ht 69.0 in | Wt 145.0 lb

## 2019-12-18 DIAGNOSIS — R413 Other amnesia: Secondary | ICD-10-CM

## 2019-12-18 DIAGNOSIS — R42 Dizziness and giddiness: Secondary | ICD-10-CM

## 2019-12-18 MED ORDER — RIVASTIGMINE 4.6 MG/24HR TD PT24: 4.6000 mg | MEDICATED_PATCH | Freq: Every day | TRANSDERMAL | 3 refills | Status: DC

## 2019-12-18 NOTE — Patient Instructions (Signed)
Continue current medications Discuss with your psychiatrist about your anxiety  Memory is overall stable  Continue routine follow-up with your primary doctor  See you back in 6 months

## 2019-12-18 NOTE — Progress Notes (Signed)
PATIENT: Laura Pollard DOB: [birth year]  REASON FOR VISIT: follow up HISTORY FROM: patient  HISTORY OF PRESENT ILLNESS: Today 12/18/19 Laura Pollard is a 79 year old female with history of mild cognitive impairment. She is on Exelon patch. 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  It is felt that this has some bearing on her sensation of dizziness. She was in the ER in July for nausea and headache, MRI of the brain was negative, was given headache cocktail. Before the ER visit, she had a fall a few days before in the [place] parking lot.  She is under significant stress caring for her husband, who has cervical dystonia and Parkinson's disease.  Feels her memory is overall stable, but if she is interrupted, she loses her thought process.  She may misplace things.  She does not have any family nearby for support, feels overwhelmed caring for her husband.  Presents today for evaluation unaccompanied.  HISTORY 03/20/2019 Dr. Jannifer Franklin: Laura Pollard is a 79 year old right-handed white female with a history of mild cognitive impairment, she is on the Exelon patch.  She has not been seen in about 15 months, she has had very little change in her memory over time.  She has not given up any activities of daily living because of memory.  She is tolerating the Exelon patch fairly well.  She is under a lot of stress taking care of her husband who has skin and bladder cancer and a dropped head syndrome.  The patient is on Prozac.  The patient continues to have a woozy feeling in the head, she will bump into walls on occasion but she has not had any falls, she does not use a cane or a walker for ambulation.  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  It is felt that this has some bearing on her sensation of dizziness.   REVIEW OF SYSTEMS: Out of a complete 14 system review of symptoms, the patient complains only of the following symptoms, and all other reviewed systems are negative.  Memory loss, anxiety  ALLERGIES: Allergies  Allergen Reactions   Gabapentin     Dizziness    Indocin [Indomethacin] Other (See Comments)    Nausea and vomiting    Keflex [Cephalexin] Other (See Comments)    unknown reaction    Lipitor [Atorvastatin] Other (See Comments)    Muscle aches    Pravastatin Other (See Comments)    Muscle aches    Tape Rash    PAPER TAPE --- RASH    HOME MEDICATIONS: Outpatient Medications Prior to Visit  Medication Sig Dispense Refill   aspirin EC 81 MG EC tablet Take 1 tablet (81 mg total) by mouth daily. 30 tablet 1   atorvastatin (LIPITOR) 10 MG tablet Take 10 mg by mouth daily.     cholecalciferol (VITAMIN D) 1000 UNITS tablet Take 2,000 Units by mouth daily.     FLUoxetine (PROZAC) 20 MG tablet Take 20 mg by mouth daily.     rivastigmine (EXELON) 4.6 mg/24hr Place 1 patch (4.6 mg total) onto the skin daily. 90 patch 3   ondansetron (ZOFRAN ODT) 4 MG disintegrating tablet 4mg  ODT q4 hours prn nausea/vomit 20 tablet 0   No facility-administered  medications prior to visit.    PAST MEDICAL HISTORY: Past Medical History:  Diagnosis Date   Abnormal heart rhythm    Anxiety    Arthritis    Atypical chest pain    normal ETT/Echo in 2003.. 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   Scoliosis     PAST SURGICAL HISTORY: Past Surgical History:  Procedure  Laterality Date   CARDIAC CATHETERIZATION  2010   normal   cataract surgery     CESAREAN SECTION     COLONOSCOPY     ETT  2003   also ECHO..normal   OVARIAN CYST SURGERY     RETINAL DETACHMENT SURGERY Right 10 years ago   TONSILLECTOMY      FAMILY HISTORY: Family History  Problem Relation Age of Onset   Heart disease Mother    CAD Mother    AAA (abdominal aortic aneurysm) Father    Bladder Cancer Father    Kidney cancer Sister        metastasis to lung   HIV Son    Breast cancer Maternal Aunt    Breast cancer Cousin     SOCIAL HISTORY: Social History   Socioeconomic History   Marital status: Married    Spouse name: Not on file   Number of children: 2   Years of education: some coll.   Highest education level: Not on file  Occupational History   Not on file  Tobacco Use   Smoking status: Former Smoker    Quit date: 05/11/1966    Years since quitting: 53.6   Smokeless tobacco: Never Used  Substance and Sexual Activity   Alcohol use: Yes    Alcohol/week: 0.0 standard drinks    Comment: rarely   Drug use: No   Sexual activity: Not on file  Other Topics Concern   Not on file  Social History Narrative   Patient is right handed.   Patient drinks 1-2 cups of caffeine daily.   Social Determinants of Health   Financial Resource Strain: Low Risk    Difficulty of Paying Living Expenses: Not hard at all  Food Insecurity: No Food Insecurity   Worried About Charity fundraiser in the Last Year: Never true   [place] in the Last Year: Never true  Transportation Needs: No Transportation Needs   Lack of Transportation (Medical): No   Lack of Transportation (Non-Medical): No  Physical Activity: Inactive   Days of Exercise per Week: 0 days   Minutes of Exercise per Session: 0 min  Stress: Stress Concern Present   Feeling of Stress : Rather much  Social Connections: Socially Isolated   Frequency of Communication with Friends  and Family: Once a week   Frequency of Social Gatherings with Friends and Family: Never   Attends Religious Services: Never   Marine scientist or Organizations: No   Attends Music therapist: Never   Marital Status: Married  Human resources officer Violence: Not At Risk   Fear of Current or Ex-Partner: No   Emotionally Abused: No   Physically Abused: No   Sexually Abused: No   PHYSICAL EXAM  Vitals:   12/18/19 0920  BP: 121/66  Pulse: 76  Weight: 145 lb (65.8 kg)  Height: 5\' 9"  (1.753 m)   Body mass index is 21.41 kg/m.  Generalized: Well developed, in no acute distress  MMSE - Mini Mental State Exam 12/18/2019 03/20/2019 11/09/2017  Orientation to time 5 5 5   Orientation to Place 5 5 4   Registration 3 3 3   Attention/ Calculation 5 3 5   Recall 3 3 3   Language- name 2 objects 2 2 2   Language- repeat 1 1 1   Language- follow 3 step command 3 3 3   Language- read & follow direction 1 1 1   Write a sentence 1 1 1   Copy design 1 1 1   Total score 30 28 29     Neurological examination  Mentation: Alert oriented to time, place, history taking. Follows all commands speech and language fluent Cranial nerve II-XII: Pupils were equal round reactive to light. Extraocular movements were full, visual field were full on confrontational test. Facial sensation and strength were normal. Head turning and shoulder shrug were normal and symmetric. Motor: The motor testing reveals 5 over 5 strength of all 4 extremities. Good symmetric motor tone is noted throughout.  Sensory: Sensory testing is intact to soft touch on all 4 extremities. No evidence of extinction is noted.  Coordination: Cerebellar testing reveals good finger-nose-finger and heel-to-shin bilaterally.  Gait and station: Gait is normal. Tandem gait is slightly unsteady.. Romberg is negative. No drift is seen.  Reflexes: Deep tendon reflexes are symmetric and normal bilaterally.   DIAGNOSTIC DATA (LABS, IMAGING,  TESTING) - I reviewed patient records, labs, notes, testing and imaging myself where available.  Lab Results  Component Value Date   WBC 7.1 11/14/2019   HGB 15.0 11/14/2019   HCT 44.0 11/14/2019   MCV 90.1 11/14/2019   PLT 177 11/14/2019      Component Value Date/Time   NA 143 11/14/2019 1141   K 3.9 11/14/2019 1141   CL 102 11/14/2019 1141   CO2 25 11/14/2019 1132   GLUCOSE 148 (H) 11/14/2019 1141   BUN 16 11/14/2019 1141   CREATININE 0.60 11/14/2019 1141   CALCIUM 10.1 11/14/2019 1132   PROT 8.7 (H) 11/14/2019 1132   ALBUMIN 5.4 (H) 11/14/2019 1132   AST 38 11/14/2019 1132   ALT 44 11/14/2019 1132   ALKPHOS 81 11/14/2019 1132   BILITOT 1.6 (H) 11/14/2019 1132   GFRNONAA >60 11/14/2019 1132   GFRAA >60 11/14/2019 1132   Lab Results  Component Value Date   CHOL 185 03/13/2017   HDL 44 03/13/2017   LDLCALC 113 (H) 03/13/2017   TRIG 142 03/13/2017   CHOLHDL 4.2 03/13/2017   Lab Results  Component Value Date   HGBA1C 5.5 03/13/2017   Lab Results  Component Value Date   VITAMINB12 1,633 (H) 09/14/2014   No results found for: TSH    ASSESSMENT AND PLAN 79 y.o. year old female  has a past medical history of Abnormal heart rhythm, Anxiety, Arthritis, Atypical chest pain, Concussion with loss of consciousness (04/27/2016), Depression, Gait disorder (09/14/2014), Headache, Hypercholesteremia, Hyperlipidemia, Liver disease, Memory difficulties (02/05/2015), Multiple allergies, MVP (mitral valve prolapse), Nocturnal leg cramps (09/14/2014), Post-concussion vertigo (04/27/2016), and Scoliosis. here with:  1.  Mild cognitive impairment 2.  Right vertebral artery fusiform aneurysm, tortuosity, brainstem compression 3.  Dizziness chronic  She remains under significant stress, along with high degree of anxiety, mostly related to feeling overwhelmed caring for her husband.  She is planning  to get him established with another primary doctor, he may require a neurological referral  to our office.  Her memory score is intact, 30/30.  I encouraged her to discuss her anxiety with her psychiatrist.  She will remain on Exelon patch 4.6 mg/24 hours.  She will follow-up in 6 months or sooner if needed.  MRI of the brain 11/14/2019 (ER visit for vomiting, headache, post fall 10 days prior) IMPRESSION: No change since November 2018. Mild age related volume loss and chronic small-vessel change of the cerebral hemispheric white matter.  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  I spent 30 minutes of face-to-face and non-face-to-face time with patient.  This included previsit chart review, lab review, study review, order entry, electronic health record documentation, patient education.    Butler Denmark, AGNP-C, DNP 12/18/2019, 9:29 AM [facility] [address] [place], [place] [address] [phone]

## 2019-12-19 NOTE — Progress Notes (Signed)
I have read the note, and I agree with the clinical assessment and plan.  Quinetta Shilling K Emani Taussig   

## 2019-12-26 DIAGNOSIS — F3341 Major depressive disorder, recurrent, in partial remission: Secondary | ICD-10-CM | POA: Diagnosis not present

## 2020-01-29 ENCOUNTER — Telehealth: Payer: Self-pay | Admitting: Neurology

## 2020-01-29 NOTE — Telephone Encounter (Signed)
[employer] [facility]. [phone] I called pt and relayed this to her and gave her #.  She appreciated call.

## 2020-01-29 NOTE — Telephone Encounter (Signed)
Pt called, lost list of Pediatric Group recommended by NP. Would like a call from the nurse.

## 2020-03-19 DIAGNOSIS — E782 Mixed hyperlipidemia: Secondary | ICD-10-CM | POA: Diagnosis not present

## 2020-03-19 DIAGNOSIS — G459 Transient cerebral ischemic attack, unspecified: Secondary | ICD-10-CM | POA: Diagnosis not present

## 2020-03-19 DIAGNOSIS — E785 Hyperlipidemia, unspecified: Secondary | ICD-10-CM | POA: Diagnosis not present

## 2020-03-19 DIAGNOSIS — F325 Major depressive disorder, single episode, in full remission: Secondary | ICD-10-CM | POA: Diagnosis not present

## 2020-04-22 DIAGNOSIS — E782 Mixed hyperlipidemia: Secondary | ICD-10-CM | POA: Diagnosis not present

## 2020-04-22 DIAGNOSIS — Z6823 Body mass index (BMI) 23.0-23.9, adult: Secondary | ICD-10-CM | POA: Diagnosis not present

## 2020-04-22 DIAGNOSIS — K5900 Constipation, unspecified: Secondary | ICD-10-CM | POA: Diagnosis not present

## 2020-04-22 DIAGNOSIS — Z0000 Encounter for general adult medical examination without abnormal findings: Secondary | ICD-10-CM | POA: Diagnosis not present

## 2020-04-22 DIAGNOSIS — Z636 Dependent relative needing care at home: Secondary | ICD-10-CM | POA: Diagnosis not present

## 2020-04-22 DIAGNOSIS — Z1211 Encounter for screening for malignant neoplasm of colon: Secondary | ICD-10-CM | POA: Diagnosis not present

## 2020-05-23 DIAGNOSIS — F3341 Major depressive disorder, recurrent, in partial remission: Secondary | ICD-10-CM | POA: Diagnosis not present

## 2020-06-17 DIAGNOSIS — F3341 Major depressive disorder, recurrent, in partial remission: Secondary | ICD-10-CM | POA: Diagnosis not present

## 2020-06-26 ENCOUNTER — Ambulatory Visit: Payer: PPO | Admitting: Neurology

## 2020-06-27 ENCOUNTER — Ambulatory Visit: Payer: PPO | Admitting: Neurology

## 2020-06-27 ENCOUNTER — Encounter: Payer: Self-pay | Admitting: Neurology

## 2020-06-27 VITALS — BP 146/78 | HR 78 | Ht 67.0 in | Wt 149.8 lb

## 2020-06-27 DIAGNOSIS — R413 Other amnesia: Secondary | ICD-10-CM | POA: Diagnosis not present

## 2020-06-27 DIAGNOSIS — R42 Dizziness and giddiness: Secondary | ICD-10-CM

## 2020-06-27 MED ORDER — MEMANTINE HCL 5 MG PO TABS
ORAL_TABLET | ORAL | 0 refills | Status: DC
Start: 2020-06-27 — End: 2020-07-22

## 2020-06-27 NOTE — Patient Instructions (Signed)
We will start namenda for the memory.

## 2020-06-27 NOTE — Progress Notes (Signed)
Reason for visit: Memory disorder, dizziness  Laura Pollard is an 80 y.o. female  History of present illness:  Laura Pollard is a 80 year old right-handed white female with a history of mild cognitive impairment.  The patient remains on low-dose Exelon patch.  She seems to tolerate it fairly well but she does report fecal urgency that has come on since she started the medication.  She does not have overt diarrhea.  The patient is under a lot of stress taking care of her husband who has Parkinson's disease and other medical illnesses.  She has a caretaker coming out to help out with her husband 3 times a week.  The patient is on Prozac, she has reported occasional "waves" going through her head lasting a second or so.  The patient is concerned about this.  She is able to operate a motor vehicle without difficulty, she does not have dizziness while sitting down.  She returns the office today for further evaluation.  Past Medical History:  Diagnosis Date  . Abnormal heart rhythm   . Anxiety   . Arthritis   . Atypical chest pain    normal ETT/Echo in 2003.. card cath normal 2010  . Concussion with loss of consciousness 04/27/2016  . Depression   . 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 Headache   . Hypercholesteremia   . Hyperlipidemia   . Liver disease   . Memory difficulties 02/05/2015  . Multiple allergies   . MVP (mitral valve prolapse)   . Nocturnal leg cramps 09/14/2014  . 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 Scoliosis     Past Surgical History:  Procedure Laterality Date  . CARDIAC CATHETERIZATION  2010   normal  . cataract surgery    . CESAREAN SECTION    . COLONOSCOPY    . ETT  2003   also ECHO..normal  . OVARIAN CYST SURGERY    . RETINAL DETACHMENT SURGERY Right 10 years ago  . TONSILLECTOMY      Family History  Problem Relation Age of Onset  . Heart disease Mother   . CAD Mother   . AAA (abdominal aortic aneurysm) Father   . Bladder Cancer Father   . Kidney cancer  Sister        metastasis to lung  . HIV Son   . Breast cancer Maternal Aunt   . Breast cancer Cousin     Social history:  reports that she quit smoking about 54 years ago. She has never used smokeless tobacco. She reports current alcohol use. She reports that she does not use drugs.    Allergies  Allergen Reactions  . Gabapentin     Dizziness   . Indocin [Indomethacin] Other (See Comments)    Nausea and vomiting   . Keflex [Cephalexin] Other (See Comments)    unknown reaction   . Lipitor [Atorvastatin] Other (See Comments)    Muscle aches   . Pravastatin Other (See Comments)    Muscle aches   . Tape Rash    PAPER TAPE --- RASH    Medications:  Prior to Admission medications   Medication Sig Start Date End Date Taking? Authorizing Provider  aspirin EC 81 MG EC tablet Take 1 tablet (81 mg total) by mouth daily. 03/14/17  Yes Gherghe, Vella Redhead, MD  atorvastatin (LIPITOR) 10 MG tablet Take 10 mg by mouth daily. 09/04/19  Yes [provider]  cholecalciferol (VITAMIN D) 1000 UNITS tablet Take 2,000 Units by mouth daily.   Yes [provider]  FLUoxetine (PROZAC) 20 MG tablet Take 20 mg by mouth daily.   Yes [provider]  rivastigmine (EXELON) 4.6 mg/24hr Place 1 patch (4.6 mg total) onto the skin daily. 12/18/19  Yes Suzzanne Cloud, NP    ROS:  Out of a complete 14 system review of symptoms, the patient complains only of the following symptoms, and all other reviewed systems are negative.  Dizziness Memory problems  Blood pressure (!) 146/78, pulse 78, height 5\' 7"  (1.702 m), weight 149 lb 12.8 oz (67.9 kg).  Physical Exam  General: The patient is alert and cooperative at the time of the examination.  Skin: No significant peripheral edema is noted.   Neurologic Exam  Mental status: The patient is alert and oriented x 3 at the time of the examination. The patient has apparent normal recent and remote memory, with an apparently normal  attention span and concentration ability.   Cranial nerves: Facial symmetry is present. Speech is normal, no aphasia or dysarthria is noted. Extraocular movements are full. Visual fields are full.  Motor: The patient has good strength in all 4 extremities.  Sensory examination: Soft touch sensation is symmetric on the face, arms, and legs.  Coordination: The patient has good finger-nose-finger and heel-to-shin bilaterally.  Gait and station: The patient has a normal gait. Tandem gait is normal. Romberg is negative. No drift is seen.  Reflexes: Deep tendon reflexes are symmetric.   Assessment/Plan:  1.  Mild cognitive impairment  2.  History of dizziness  The patient may be having "brain shocks" from the chronic use of Prozac.  This sensation is different from her usual dizziness.  The patient is on Exelon, she will continue on her current dose.  I fear that if I can increase the dose of the Exelon she may have increased problems with her bowels.  We will add Namenda to the regimen as she believes that her memory has slightly worsened over time.  She will follow up here in 6 months.  Jill Alexanders MD 06/27/2020 1:51 PM  [facility] [address] [place], [place] [address]  Phone [phone] Fax [phone]

## 2020-07-15 DIAGNOSIS — F3341 Major depressive disorder, recurrent, in partial remission: Secondary | ICD-10-CM | POA: Diagnosis not present

## 2020-07-15 DIAGNOSIS — F410 Panic disorder [episodic paroxysmal anxiety] without agoraphobia: Secondary | ICD-10-CM | POA: Diagnosis not present

## 2020-07-20 ENCOUNTER — Other Ambulatory Visit: Payer: Self-pay | Admitting: Neurology

## 2020-07-28 DIAGNOSIS — Z681 Body mass index (BMI) 19 or less, adult: Secondary | ICD-10-CM | POA: Diagnosis not present

## 2020-07-28 DIAGNOSIS — L239 Allergic contact dermatitis, unspecified cause: Secondary | ICD-10-CM | POA: Diagnosis not present

## 2020-07-30 ENCOUNTER — Telehealth: Payer: Self-pay | Admitting: Neurology

## 2020-07-30 MED ORDER — MEMANTINE HCL 5 MG PO TABS: ORAL_TABLET | ORAL | 2 refills | Status: DC

## 2020-07-30 NOTE — Telephone Encounter (Signed)
Pt called, need information on memantine (NAMENDA) 5 MG tablet. I'm confused about the dosage. Would like a call from the nurse.

## 2020-07-30 NOTE — Telephone Encounter (Addendum)
Called patient who stated she doesn't need refill on namenda. She stated she only has 20 tablets left. 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 She stated she only got about 70 tablets from pharmacy. I advised I'll call pharmacy to check on # she received, will call her back. Patient verbalized understanding, appreciation. Called [facility], spoke with Donny Pique who stated patient got 70 tablets in Feb from new Rx, she can get refills on Rx that was sent on 07/22/20. The refill was sent in with titration instructions but patient is now on maintenance dose of 2 tabs twice daily. I advised will discontinue Rx from 07/22/20 and send in new Rx with maintenance dose: take two tabs twice daily. This will prevent confusion on patient's part. Donny Pique will wait for new Rx before refilling.  Called patient and advised her that I called [facility], advised that she should be taking two tablets 2 times a day. Advised her those instructions will be on new refill bottle. She Patient verbalized understanding, appreciation.

## 2020-07-30 NOTE — Telephone Encounter (Signed)
Pt request refill memantine (NAMENDA) 5 MG tablet at [id number]

## 2020-07-30 NOTE — Telephone Encounter (Signed)
Returned patient call and went over taking Namenda.  She still had the titration pack and was getting confused.    She was able to return verbalization regarding proper dosage, amount of tablets/milligrams and how often she is to be taking medication.  I suggested she throw out the titration bottle with those directions.  Patient denied further questions, verbalized understanding and expressed appreciation for the phone call.

## 2020-08-01 DIAGNOSIS — L255 Unspecified contact dermatitis due to plants, except food: Secondary | ICD-10-CM | POA: Diagnosis not present

## 2020-08-07 DIAGNOSIS — Z1211 Encounter for screening for malignant neoplasm of colon: Secondary | ICD-10-CM | POA: Diagnosis not present

## 2020-08-19 DIAGNOSIS — L308 Other specified dermatitis: Secondary | ICD-10-CM | POA: Diagnosis not present

## 2020-08-19 DIAGNOSIS — D485 Neoplasm of uncertain behavior of skin: Secondary | ICD-10-CM | POA: Diagnosis not present

## 2020-08-19 DIAGNOSIS — L239 Allergic contact dermatitis, unspecified cause: Secondary | ICD-10-CM | POA: Diagnosis not present

## 2020-08-19 DIAGNOSIS — L120 Bullous pemphigoid: Secondary | ICD-10-CM | POA: Diagnosis not present

## 2020-08-21 DIAGNOSIS — F3341 Major depressive disorder, recurrent, in partial remission: Secondary | ICD-10-CM | POA: Diagnosis not present

## 2020-08-27 DIAGNOSIS — F3341 Major depressive disorder, recurrent, in partial remission: Secondary | ICD-10-CM | POA: Diagnosis not present

## 2020-08-27 DIAGNOSIS — F410 Panic disorder [episodic paroxysmal anxiety] without agoraphobia: Secondary | ICD-10-CM | POA: Diagnosis not present

## 2020-10-01 DIAGNOSIS — F410 Panic disorder [episodic paroxysmal anxiety] without agoraphobia: Secondary | ICD-10-CM | POA: Diagnosis not present

## 2020-10-01 DIAGNOSIS — F3341 Major depressive disorder, recurrent, in partial remission: Secondary | ICD-10-CM | POA: Diagnosis not present

## 2020-10-09 ENCOUNTER — Telehealth: Payer: Self-pay | Admitting: Neurology

## 2020-10-09 NOTE — Telephone Encounter (Signed)
Pt request refill rivastigmine (EXELON) 4.6 mg/24hr at [id number]

## 2020-10-10 NOTE — Telephone Encounter (Signed)
Called and confirmed with Laura Pollard @ [facility] pharmacy that her prescription was refilled yesterday @ 552pm through the driveway.   LVM for patient to go over the prescription refill.

## 2020-10-10 NOTE — Telephone Encounter (Signed)
LVM for patient to return call.  If patient returns call, please forward her this information      Patient's exelon was refilled on 12/18/19 for 90 days with 3 refills.  She should not need a refill.  She just picked up a 90 day supply yesterday and has 4 months remaining.     Thank you Doroteo Bradford, RN

## 2020-10-10 NOTE — Telephone Encounter (Signed)
Phone rep read message from [place] word for word to pt and she stated she is confused.  Pt states she didn't pick up exelon.  Please call.

## 2020-10-14 NOTE — Telephone Encounter (Signed)
LVM for patient to return call. 

## 2020-10-15 NOTE — Telephone Encounter (Signed)
Reached patient by phone this morning and she said that she did have the prescription.  Patient denied further questions, verbalized understanding and expressed appreciation for the phone call.

## 2020-10-24 DIAGNOSIS — F331 Major depressive disorder, recurrent, moderate: Secondary | ICD-10-CM | POA: Diagnosis not present

## 2020-10-24 DIAGNOSIS — F411 Generalized anxiety disorder: Secondary | ICD-10-CM | POA: Diagnosis not present

## 2020-10-25 ENCOUNTER — Other Ambulatory Visit: Payer: Self-pay | Admitting: Neurology

## 2020-10-30 ENCOUNTER — Telehealth: Payer: Self-pay | Admitting: Neurology

## 2020-10-30 MED ORDER — MEMANTINE HCL 5 MG PO TABS: ORAL_TABLET | ORAL | 1 refills | Status: DC

## 2020-10-30 NOTE — Telephone Encounter (Signed)
LVM for return call. 

## 2020-10-30 NOTE — Telephone Encounter (Signed)
Pt is asking for a call from RN as she states she does not feel like herself while on her memantine (NAMENDA) 5 MG tablet.

## 2020-10-30 NOTE — Telephone Encounter (Signed)
I called the patient.  She has been on Namenda 10 mg twice daily for a number of months but just recently started having dizziness on the medication.  We will cut back the dose to taking 5 mg twice daily.  She will call me if the symptoms persist.

## 2020-10-30 NOTE — Telephone Encounter (Signed)
Reached patient by phone, she said, "Oh thank God you called, I have trouble with my meds and I don't know if I am doing it corrrect".  She told me she is taking 2 tablets twice a day but she feels very dizzy like something is wrong when she takes it.    Recommended she call her PCP to make sure she doesn't have a UTI, she said she has not, encouraged her to do so, but she said she is having a lot of problems with her Colon, as in she is having diarrhea and constipation both, and she lost control of her bowels not to long ago, her abdomen is distended.    Patient is taking the medication correctly,

## 2020-10-30 NOTE — Addendum Note (Signed)
Addended by: Kathrynn Ducking on: 10/30/2020 04:38 PM   Modules accepted: Orders

## 2020-11-21 DIAGNOSIS — H3321 Serous retinal detachment, right eye: Secondary | ICD-10-CM | POA: Diagnosis not present

## 2020-11-21 DIAGNOSIS — Z961 Presence of intraocular lens: Secondary | ICD-10-CM | POA: Diagnosis not present

## 2020-11-25 DIAGNOSIS — F411 Generalized anxiety disorder: Secondary | ICD-10-CM | POA: Diagnosis not present

## 2020-11-25 DIAGNOSIS — F331 Major depressive disorder, recurrent, moderate: Secondary | ICD-10-CM | POA: Diagnosis not present

## 2020-12-02 ENCOUNTER — Other Ambulatory Visit: Payer: Self-pay | Admitting: Family Medicine

## 2020-12-02 DIAGNOSIS — Z1231 Encounter for screening mammogram for malignant neoplasm of breast: Secondary | ICD-10-CM

## 2020-12-18 DIAGNOSIS — F331 Major depressive disorder, recurrent, moderate: Secondary | ICD-10-CM | POA: Diagnosis not present

## 2020-12-30 DIAGNOSIS — F331 Major depressive disorder, recurrent, moderate: Secondary | ICD-10-CM | POA: Diagnosis not present

## 2020-12-30 DIAGNOSIS — F411 Generalized anxiety disorder: Secondary | ICD-10-CM | POA: Diagnosis not present

## 2021-01-04 ENCOUNTER — Other Ambulatory Visit: Payer: Self-pay | Admitting: Neurology

## 2021-01-06 ENCOUNTER — Encounter: Payer: Self-pay | Admitting: Neurology

## 2021-01-06 ENCOUNTER — Other Ambulatory Visit: Payer: Self-pay

## 2021-01-06 ENCOUNTER — Ambulatory Visit: Payer: PPO | Admitting: Neurology

## 2021-01-06 VITALS — BP 124/78 | HR 82 | Ht 67.0 in | Wt 146.6 lb

## 2021-01-06 DIAGNOSIS — R269 Unspecified abnormalities of gait and mobility: Secondary | ICD-10-CM | POA: Diagnosis not present

## 2021-01-06 DIAGNOSIS — R413 Other amnesia: Secondary | ICD-10-CM | POA: Diagnosis not present

## 2021-01-06 DIAGNOSIS — R42 Dizziness and giddiness: Secondary | ICD-10-CM | POA: Diagnosis not present

## 2021-01-06 MED ORDER — MEMANTINE HCL 5 MG PO TABS: ORAL_TABLET | ORAL | 1 refills | Status: DC

## 2021-01-06 NOTE — Progress Notes (Signed)
Reason for visit: Dizziness, memory disturbance  Laura Pollard is an 80 y.o. female  History of present illness:  Laura Pollard is a 80 year old right-handed white female with a history of a mild memory disturbance.  The patient is under a lot of stress taking care of her husband who has medical problems including Parkinson's disease.  The patient has had ongoing dizziness, she was asked to drop the Namenda dose to 5 mg twice daily but never did, she continues to take 10 mg twice daily.  The patient has not had any falls but she remains quite dizzy.  The patient over the last years had some painful itching sensation in the medial aspect of her right shoulder blade.  She does have some mild neck pain without pain down the arms.  She has been seen through a dermatologist, topical medications have not been effective.  The patient is very concerned about her memory.  She does use an Exelon patch as well.  Past Medical History:  Diagnosis Date   Abnormal heart rhythm    Anxiety    Arthritis    Atypical chest pain    normal ETT/Echo in 2003.. 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   Scoliosis     Past Surgical History:  Procedure Laterality Date   CARDIAC CATHETERIZATION  2010   normal   cataract surgery     CESAREAN SECTION     COLONOSCOPY     ETT  2003   also ECHO..normal   OVARIAN CYST SURGERY     RETINAL DETACHMENT SURGERY Right 10 years ago   TONSILLECTOMY      Family History  Problem Relation Age of Onset   Heart disease Mother    CAD Mother    AAA (abdominal aortic aneurysm) Father    Bladder Cancer Father    Kidney cancer Sister        metastasis to lung   HIV Son     Breast cancer Maternal Aunt    Breast cancer Cousin     Social history:  reports that she quit smoking about 54 years ago. She has never used smokeless tobacco. She reports current alcohol use. She reports that she does not use drugs.    Allergies  Allergen Reactions   Gabapentin     Dizziness    Indocin [Indomethacin] Other (See Comments)    Nausea and vomiting    Keflex [Cephalexin] Other (See Comments)    unknown reaction    Lipitor [Atorvastatin] Other (See Comments)    Muscle aches    Pravastatin Other (See Comments)    Muscle aches    Tape Rash    PAPER TAPE --- RASH    Medications:  Prior to Admission medications   Medication Sig Start Date End Date Taking? Authorizing Provider  atorvastatin (LIPITOR) 10 MG tablet Take 10 mg by mouth daily. 09/04/19  Yes [provider]  cholecalciferol (VITAMIN D) 1000 UNITS tablet Take 2,000 Units by mouth daily.   Yes [provider]  FLUoxetine (PROZAC) 20 MG tablet Take 20 mg by mouth daily.   Yes [provider]  memantine [facility])  5 MG tablet Take 1 tablet BY MOUTH TWICE A DAY Patient taking differently: Take 10 mg by mouth 2 (two) times daily. Take 1 tablet BY MOUTH TWICE A DAY 10/30/20  Yes Kathrynn Ducking, MD  rivastigmine (EXELON) 4.6 mg/24hr Place 1 patch (4.6 mg total) onto the skin daily. 12/18/19  Yes Suzzanne Cloud, NP  aspirin EC 81 MG EC tablet Take 1 tablet (81 mg total) by mouth daily. Patient not taking: Reported on 01/06/2021 03/14/17   Caren Griffins, MD  predniSONE (STERAPRED UNI-PAK 21 TAB) 5 MG (21) TBPK tablet See admin instructions. see package 07/28/20   [provider]    ROS:  Out of a complete 14 system review of symptoms, the patient complains only of the following symptoms, and all other reviewed systems are negative.  Dizziness Memory problems  Blood pressure 124/78, pulse 82, height '5\' 7"'$  (1.702 m), weight 146 lb 9.6 oz (66.5 kg).  Physical  Exam  General: The patient is alert and cooperative at the time of the examination.  Skin: No significant peripheral edema is noted.   Neurologic Exam  Mental status: The patient is alert and oriented x 3 at the time of the examination. The Mini-Mental status examination done today shows a total score 24/30.   Cranial nerves: Facial symmetry is present. Speech is normal, no aphasia or dysarthria is noted. Extraocular movements are full. Visual fields are full.  Motor: The patient has good strength in all 4 extremities.  Sensory examination: Soft touch sensation is symmetric on the face, arms, and legs.  Coordination: The patient has good finger-nose-finger and heel-to-shin bilaterally.  Gait and station: The patient has a normal gait. Tandem gait is very unsteady. Romberg is negative, but is unsteady. No drift is seen.  Reflexes: Deep tendon reflexes are symmetric.   Assessment/Plan:  1.  Chronic dizziness  2.  Memory disturbance  The patient is to reduce the Namenda to 5 mg twice daily, if the dizziness continues, we will stop the medication.  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  The patient will be followed for the memory issues.  In the future, she can be followed through Dr. Leta Baptist.  The itching sensation in the medial aspect of the right shoulder blade could potentially be related to the cervical spine.  We may consider MRI of the cervical spine in the future.  Jill Alexanders MD 01/06/2021 9:14 AM  [facility] [address] [place], [place] [address]  Phone [phone] Fax [phone]

## 2021-01-06 NOTE — Patient Instructions (Signed)
Reduce the namenda 5 mg tablet to one twice a day.

## 2021-01-24 ENCOUNTER — Ambulatory Visit: Payer: PPO

## 2021-01-28 DIAGNOSIS — R0602 Shortness of breath: Secondary | ICD-10-CM | POA: Diagnosis not present

## 2021-01-28 DIAGNOSIS — U071 COVID-19: Secondary | ICD-10-CM | POA: Diagnosis not present

## 2021-01-28 DIAGNOSIS — R051 Acute cough: Secondary | ICD-10-CM | POA: Diagnosis not present

## 2021-02-24 ENCOUNTER — Ambulatory Visit
Admission: RE | Admit: 2021-02-24 | Discharge: 2021-02-24 | Disposition: A | Discharge status: Home / Self Care | Payer: PPO | Source: Ambulatory Visit | Attending: Family Medicine | Admitting: Family Medicine

## 2021-02-24 ENCOUNTER — Other Ambulatory Visit: Payer: Self-pay

## 2021-02-24 DIAGNOSIS — Z1231 Encounter for screening mammogram for malignant neoplasm of breast: Secondary | ICD-10-CM | POA: Diagnosis not present

## 2021-03-03 DIAGNOSIS — F331 Major depressive disorder, recurrent, moderate: Secondary | ICD-10-CM | POA: Diagnosis not present

## 2021-03-03 DIAGNOSIS — F411 Generalized anxiety disorder: Secondary | ICD-10-CM | POA: Diagnosis not present

## 2021-03-24 DIAGNOSIS — F410 Panic disorder [episodic paroxysmal anxiety] without agoraphobia: Secondary | ICD-10-CM | POA: Diagnosis not present

## 2021-03-24 DIAGNOSIS — F411 Generalized anxiety disorder: Secondary | ICD-10-CM | POA: Diagnosis not present

## 2021-03-24 DIAGNOSIS — F331 Major depressive disorder, recurrent, moderate: Secondary | ICD-10-CM | POA: Diagnosis not present

## 2021-04-21 DIAGNOSIS — F410 Panic disorder [episodic paroxysmal anxiety] without agoraphobia: Secondary | ICD-10-CM | POA: Diagnosis not present

## 2021-04-21 DIAGNOSIS — F331 Major depressive disorder, recurrent, moderate: Secondary | ICD-10-CM | POA: Diagnosis not present

## 2021-04-21 DIAGNOSIS — F411 Generalized anxiety disorder: Secondary | ICD-10-CM | POA: Diagnosis not present

## 2021-04-23 DIAGNOSIS — Z1389 Encounter for screening for other disorder: Secondary | ICD-10-CM | POA: Diagnosis not present

## 2021-04-23 DIAGNOSIS — Z23 Encounter for immunization: Secondary | ICD-10-CM | POA: Diagnosis not present

## 2021-04-23 DIAGNOSIS — Z79899 Other long term (current) drug therapy: Secondary | ICD-10-CM | POA: Diagnosis not present

## 2021-04-23 DIAGNOSIS — E559 Vitamin D deficiency, unspecified: Secondary | ICD-10-CM | POA: Diagnosis not present

## 2021-04-23 DIAGNOSIS — E785 Hyperlipidemia, unspecified: Secondary | ICD-10-CM | POA: Diagnosis not present

## 2021-04-23 DIAGNOSIS — Z0000 Encounter for general adult medical examination without abnormal findings: Secondary | ICD-10-CM | POA: Diagnosis not present

## 2021-04-30 DIAGNOSIS — E785 Hyperlipidemia, unspecified: Secondary | ICD-10-CM | POA: Diagnosis not present

## 2021-05-28 DIAGNOSIS — F411 Generalized anxiety disorder: Secondary | ICD-10-CM | POA: Diagnosis not present

## 2021-05-28 DIAGNOSIS — F331 Major depressive disorder, recurrent, moderate: Secondary | ICD-10-CM | POA: Diagnosis not present

## 2021-06-11 DIAGNOSIS — R2241 Localized swelling, mass and lump, right lower limb: Secondary | ICD-10-CM | POA: Diagnosis not present

## 2021-06-11 DIAGNOSIS — M79671 Pain in right foot: Secondary | ICD-10-CM | POA: Diagnosis not present

## 2021-06-12 ENCOUNTER — Other Ambulatory Visit (HOSPITAL_COMMUNITY): Payer: Self-pay | Admitting: Orthopedic Surgery

## 2021-06-19 DIAGNOSIS — F331 Major depressive disorder, recurrent, moderate: Secondary | ICD-10-CM | POA: Diagnosis not present

## 2021-06-26 ENCOUNTER — Encounter (HOSPITAL_BASED_OUTPATIENT_CLINIC_OR_DEPARTMENT_OTHER): Payer: Self-pay | Admitting: Orthopedic Surgery

## 2021-07-02 DIAGNOSIS — F410 Panic disorder [episodic paroxysmal anxiety] without agoraphobia: Secondary | ICD-10-CM | POA: Diagnosis not present

## 2021-07-02 DIAGNOSIS — F331 Major depressive disorder, recurrent, moderate: Secondary | ICD-10-CM | POA: Diagnosis not present

## 2021-07-02 DIAGNOSIS — F411 Generalized anxiety disorder: Secondary | ICD-10-CM | POA: Diagnosis not present

## 2021-07-03 ENCOUNTER — Ambulatory Visit (HOSPITAL_BASED_OUTPATIENT_CLINIC_OR_DEPARTMENT_OTHER): Payer: PPO | Admitting: Anesthesiology

## 2021-07-03 ENCOUNTER — Encounter (HOSPITAL_BASED_OUTPATIENT_CLINIC_OR_DEPARTMENT_OTHER)
Admission: RE | Disposition: A | Discharge status: Home / Self Care | Payer: Self-pay | Source: Home / Self Care | Attending: Orthopedic Surgery

## 2021-07-03 ENCOUNTER — Encounter (HOSPITAL_BASED_OUTPATIENT_CLINIC_OR_DEPARTMENT_OTHER): Payer: Self-pay | Admitting: Orthopedic Surgery

## 2021-07-03 ENCOUNTER — Ambulatory Visit (HOSPITAL_BASED_OUTPATIENT_CLINIC_OR_DEPARTMENT_OTHER): Payer: PPO

## 2021-07-03 ENCOUNTER — Other Ambulatory Visit: Payer: Self-pay

## 2021-07-03 ENCOUNTER — Ambulatory Visit (HOSPITAL_BASED_OUTPATIENT_CLINIC_OR_DEPARTMENT_OTHER)
Admission: RE | Admit: 2021-07-03 | Discharge: 2021-07-03 | Disposition: A | Discharge status: Home / Self Care | Payer: PPO | Attending: Orthopedic Surgery | Admitting: Orthopedic Surgery

## 2021-07-03 DIAGNOSIS — M71371 Other bursal cyst, right ankle and foot: Secondary | ICD-10-CM

## 2021-07-03 DIAGNOSIS — Z8673 Personal history of transient ischemic attack (TIA), and cerebral infarction without residual deficits: Secondary | ICD-10-CM | POA: Insufficient documentation

## 2021-07-03 DIAGNOSIS — M1990 Unspecified osteoarthritis, unspecified site: Secondary | ICD-10-CM | POA: Diagnosis not present

## 2021-07-03 DIAGNOSIS — I341 Nonrheumatic mitral (valve) prolapse: Secondary | ICD-10-CM | POA: Diagnosis not present

## 2021-07-03 DIAGNOSIS — F419 Anxiety disorder, unspecified: Secondary | ICD-10-CM | POA: Diagnosis not present

## 2021-07-03 DIAGNOSIS — Z87891 Personal history of nicotine dependence: Secondary | ICD-10-CM | POA: Insufficient documentation

## 2021-07-03 DIAGNOSIS — F418 Other specified anxiety disorders: Secondary | ICD-10-CM | POA: Diagnosis not present

## 2021-07-03 DIAGNOSIS — F32A Depression, unspecified: Secondary | ICD-10-CM | POA: Diagnosis not present

## 2021-07-03 DIAGNOSIS — M6740 Ganglion, unspecified site: Secondary | ICD-10-CM

## 2021-07-03 HISTORY — DX: Cerebral infarction, unspecified: I63.9

## 2021-07-03 HISTORY — PX: MASS EXCISION: SHX2000

## 2021-07-03 SURGERY — EXCISION MASS
Anesthesia: Monitor Anesthesia Care | Laterality: Right

## 2021-07-03 MED ORDER — ACETAMINOPHEN 500 MG PO TABS
ORAL_TABLET | ORAL | Status: AC
  Filled 2021-07-03: qty 2

## 2021-07-03 MED ORDER — ONDANSETRON HCL 4 MG/2ML IJ SOLN
INTRAMUSCULAR | Status: DC | PRN
  Administered 2021-07-03: 4 mg via INTRAVENOUS

## 2021-07-03 MED ORDER — CEFAZOLIN SODIUM-DEXTROSE 2-4 GM/100ML-% IV SOLN
INTRAVENOUS | Status: AC
  Filled 2021-07-03: qty 100

## 2021-07-03 MED ORDER — BUPIVACAINE-EPINEPHRINE 0.5% -1:200000 IJ SOLN
INTRAMUSCULAR | Status: DC | PRN
  Administered 2021-07-03: 10 mL

## 2021-07-03 MED ORDER — FENTANYL CITRATE (PF) 100 MCG/2ML IJ SOLN
INTRAMUSCULAR | Status: AC
  Filled 2021-07-03: qty 2

## 2021-07-03 MED ORDER — LIDOCAINE 2% (20 MG/ML) 5 ML SYRINGE
INTRAMUSCULAR | Status: DC | PRN
Start: 2021-07-03 — End: 2021-07-03
  Administered 2021-07-03: 60 mg via INTRAVENOUS

## 2021-07-03 MED ORDER — BUPIVACAINE-EPINEPHRINE (PF) 0.5% -1:200000 IJ SOLN
INTRAMUSCULAR | Status: AC
  Filled 2021-07-03: qty 30

## 2021-07-03 MED ORDER — LACTATED RINGERS IV SOLN: INTRAVENOUS | Status: DC

## 2021-07-03 MED ORDER — SODIUM CHLORIDE 0.9 % IV SOLN: INTRAVENOUS | Status: DC

## 2021-07-03 MED ORDER — 0.9 % SODIUM CHLORIDE (POUR BTL) OPTIME
TOPICAL | Status: DC | PRN
  Administered 2021-07-03: 50 mL

## 2021-07-03 MED ORDER — CEFAZOLIN SODIUM-DEXTROSE 2-4 GM/100ML-% IV SOLN
2.0000 g | INTRAVENOUS | Status: AC
  Administered 2021-07-03: 2 g via INTRAVENOUS

## 2021-07-03 MED ORDER — PROPOFOL 500 MG/50ML IV EMUL
INTRAVENOUS | Status: DC | PRN
  Administered 2021-07-03: 75 ug/kg/min via INTRAVENOUS

## 2021-07-03 MED ORDER — FENTANYL CITRATE (PF) 100 MCG/2ML IJ SOLN: 25.0000 ug | INTRAMUSCULAR | Status: DC | PRN

## 2021-07-03 MED ORDER — ACETAMINOPHEN 500 MG PO TABS
1000.0000 mg | ORAL_TABLET | Freq: Once | ORAL | Status: AC
  Administered 2021-07-03: 1000 mg via ORAL

## 2021-07-03 SURGICAL SUPPLY — 68 items
APL PRP STRL LF DISP 70% ISPRP (MISCELLANEOUS) ×1
BANDAGE ESMARK 6X9 LF (GAUZE/BANDAGES/DRESSINGS) IMPLANT
BLADE ARTHRO LOK 4 BEAVER (BLADE) IMPLANT
BLADE SURG 15 STRL LF DISP TIS (BLADE) ×1 IMPLANT
BLADE SURG 15 STRL SS (BLADE) ×2
BNDG CMPR 9X4 STRL LF SNTH (GAUZE/BANDAGES/DRESSINGS)
BNDG CMPR 9X6 STRL LF SNTH (GAUZE/BANDAGES/DRESSINGS)
BNDG COHESIVE 4X5 TAN ST LF (GAUZE/BANDAGES/DRESSINGS) IMPLANT
BNDG COHESIVE 6X5 TAN ST LF (GAUZE/BANDAGES/DRESSINGS) IMPLANT
BNDG CONFORM 3 STRL LF (GAUZE/BANDAGES/DRESSINGS) IMPLANT
BNDG ELASTIC 4X5.8 VLCR STR LF (GAUZE/BANDAGES/DRESSINGS) IMPLANT
BNDG ESMARK 4X9 LF (GAUZE/BANDAGES/DRESSINGS) IMPLANT
BNDG ESMARK 6X9 LF (GAUZE/BANDAGES/DRESSINGS)
CHLORAPREP W/TINT 26 (MISCELLANEOUS) ×2 IMPLANT
CORD BIPOLAR FORCEPS 12FT (ELECTRODE) IMPLANT
COVER BACK TABLE 60X90IN (DRAPES) ×2 IMPLANT
CUFF TOURN SGL QUICK 24 (TOURNIQUET CUFF)
CUFF TOURN SGL QUICK 34 (TOURNIQUET CUFF)
CUFF TRNQT CYL 24X4X16.5-23 (TOURNIQUET CUFF) IMPLANT
CUFF TRNQT CYL 34X4.125X (TOURNIQUET CUFF) IMPLANT
DRAPE EXTREMITY T 121X128X90 (DISPOSABLE) ×2 IMPLANT
DRAPE OEC MINIVIEW 54X84 (DRAPES) IMPLANT
DRAPE SURG 17X23 STRL (DRAPES) IMPLANT
DRAPE U-SHAPE 47X51 STRL (DRAPES) IMPLANT
DRSG MEPITEL 4X7.2 (GAUZE/BANDAGES/DRESSINGS) ×2 IMPLANT
DRSG PAD ABDOMINAL 8X10 ST (GAUZE/BANDAGES/DRESSINGS) ×2 IMPLANT
ELECT REM PT RETURN 9FT ADLT (ELECTROSURGICAL) ×2
ELECTRODE REM PT RTRN 9FT ADLT (ELECTROSURGICAL) ×1 IMPLANT
GAUZE SPONGE 4X4 12PLY STRL (GAUZE/BANDAGES/DRESSINGS) ×2 IMPLANT
GLOVE SRG 8 PF TXTR STRL LF DI (GLOVE) ×2 IMPLANT
GLOVE SURG ENC MOIS LTX SZ8 (GLOVE) ×2 IMPLANT
GLOVE SURG LTX SZ8 (GLOVE) ×2 IMPLANT
GLOVE SURG UNDER POLY LF SZ8 (GLOVE) ×4
GOWN STRL REUS W/ TWL LRG LVL3 (GOWN DISPOSABLE) ×1 IMPLANT
GOWN STRL REUS W/ TWL XL LVL3 (GOWN DISPOSABLE) ×2 IMPLANT
GOWN STRL REUS W/TWL LRG LVL3 (GOWN DISPOSABLE) ×2
GOWN STRL REUS W/TWL XL LVL3 (GOWN DISPOSABLE) ×4
NDL HYPO 25X1 1.5 SAFETY (NEEDLE) IMPLANT
NEEDLE HYPO 22GX1.5 SAFETY (NEEDLE) IMPLANT
NEEDLE HYPO 25X1 1.5 SAFETY (NEEDLE) IMPLANT
NS IRRIG 1000ML POUR BTL (IV SOLUTION) ×2 IMPLANT
PACK BASIN DAY SURGERY FS (CUSTOM PROCEDURE TRAY) ×2 IMPLANT
PAD CAST 4YDX4 CTTN HI CHSV (CAST SUPPLIES) ×1 IMPLANT
PADDING CAST ABS 4INX4YD NS (CAST SUPPLIES)
PADDING CAST ABS COTTON 4X4 ST (CAST SUPPLIES) IMPLANT
PADDING CAST COTTON 4X4 STRL (CAST SUPPLIES) ×2
PADDING CAST COTTON 6X4 STRL (CAST SUPPLIES) IMPLANT
PENCIL SMOKE EVACUATOR (MISCELLANEOUS) ×2 IMPLANT
SANITIZER HAND PURELL 535ML FO (MISCELLANEOUS) ×2 IMPLANT
SHEET MEDIUM DRAPE 40X70 STRL (DRAPES) ×2 IMPLANT
SLEEVE SCD COMPRESS KNEE MED (STOCKING) ×2 IMPLANT
SPONGE T-LAP 18X18 ~~LOC~~+RFID (SPONGE) ×2 IMPLANT
STOCKINETTE 6  STRL (DRAPES) ×2
STOCKINETTE 6 STRL (DRAPES) ×1 IMPLANT
STRIP CLOSURE SKIN 1/2X4 (GAUZE/BANDAGES/DRESSINGS) IMPLANT
SUCTION FRAZIER HANDLE 10FR (MISCELLANEOUS)
SUCTION TUBE FRAZIER 10FR DISP (MISCELLANEOUS) IMPLANT
SUT ETHILON 3 0 PS 1 (SUTURE) ×2 IMPLANT
SUT MNCRL AB 3-0 PS2 18 (SUTURE) IMPLANT
SUT VIC AB 2-0 SH 27 (SUTURE)
SUT VIC AB 2-0 SH 27XBRD (SUTURE) IMPLANT
SUT VICRYL 0 SH 27 (SUTURE) IMPLANT
SYR BULB EAR ULCER 3OZ GRN STR (SYRINGE) ×2 IMPLANT
SYR CONTROL 10ML LL (SYRINGE) IMPLANT
TOWEL GREEN STERILE FF (TOWEL DISPOSABLE) ×2 IMPLANT
TUBE CONNECTING 20X1/4 (TUBING) IMPLANT
UNDERPAD 30X36 HEAVY ABSORB (UNDERPADS AND DIAPERS) ×2 IMPLANT
YANKAUER SUCT BULB TIP NO VENT (SUCTIONS) IMPLANT

## 2021-07-03 NOTE — H&P (Signed)
Laura Pollard is an 81 y.o. female.   Chief Complaint: Right foot pain HPI: 81 year old female with a long history of a dorsal right second toe mass.  She was referred by dermatology.  This has the appearance of a mucous cyst.  She presents today for excision of the mass and local soft tissue rearrangement.  Past Medical History:  Diagnosis Date   Abnormal heart rhythm    Anxiety    Arthritis    Atypical chest pain    normal ETT/Echo in 2003.. 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   Scoliosis    Stroke ([place])    tia no deficits    Past Surgical History:  Procedure Laterality Date   CARDIAC CATHETERIZATION  2010   normal   cataract surgery     CESAREAN SECTION     COLONOSCOPY     ETT  2003   also ECHO..normal   OVARIAN CYST SURGERY     RETINAL DETACHMENT SURGERY Right 10 years ago   TONSILLECTOMY      Family History  Problem Relation Age of Onset   Heart disease Mother    CAD Mother    AAA (abdominal aortic aneurysm) Father    Bladder Cancer Father    Kidney cancer Sister        metastasis to lung   Breast cancer Maternal Aunt 66   Breast cancer Cousin    HIV Son    Social History:  reports that she quit smoking about 55 years ago. Her smoking use included cigarettes. She has never used smokeless tobacco. She reports that she does not currently use alcohol. She reports that she does not use drugs.  Allergies:  Allergies  Allergen Reactions   Gabapentin     Dizziness    Indocin [Indomethacin] Other (See Comments)    Nausea and vomiting    Keflex [Cephalexin] Other (See Comments)    unknown reaction    Lipitor [Atorvastatin] Other (See Comments)     Muscle aches    Pravastatin Other (See Comments)    Muscle aches    Tape Rash    PAPER TAPE --- RASH    Medications Prior to Admission  Medication Sig Dispense Refill   ALPRAZolam (XANAX) 0.5 MG tablet Take 0.5 mg by mouth at bedtime as needed for anxiety.     atorvastatin (LIPITOR) 10 MG tablet Take 10 mg by mouth daily.     cholecalciferol (VITAMIN D) 1000 UNITS tablet Take 2,000 Units by mouth daily.     FLUoxetine (PROZAC) 20 MG tablet Take 20 mg by mouth daily.     memantine (NAMENDA) 5 MG tablet Take 1 tablet BY MOUTH TWICE A DAY 360 tablet 1   rivastigmine (EXELON) 4.6 mg/24hr APPPLY 1 PATCH TO SKIN DAILY 90 patch 3    No results found for this or any previous visit (from the past 48 hour(s)). DG MINI C-ARM IMAGE ONLY  Result Date: 07/03/2021 There is no interpretation for this exam.  This order is for images obtained during a surgical procedure.  Please See "Surgeries" Tab for more information regarding the procedure.    Review of Systems  no recent fever, chills, nausea, vomiting or changes in her appetite  Blood pressure (!) 167/83, pulse 69, temperature (!) 97 F (36.1 C), temperature source Oral, resp. rate 16, height 5\' 9"  (1.753 m), weight 65 kg, SpO2 96 %. Physical Exam  Well-nourished well-developed elderly woman in no apparent distress.  Alert and oriented x4.  Normal mood and affect.  Extraocular motions are intact.  Respirations are unlabored.  She has a prominent mucous cyst at the medial aspect of the DIP joint of her right foot second toe.  Skin is healthy and intact.  Pulses are palpable.  No lymphadenopathy.   Assessment/Plan Painful right second toe DIP joint mucous cyst -to the operating room today for excision of the cyst and rotational flap coverage.  The risks and benefits of the alternative treatment options have been discussed in detail.  The patient wishes to proceed with surgery and specifically understands risks of bleeding, infection, nerve damage,  blood clots, need for additional surgery, amputation and death.   Wylene Simmer, MD July 20, 2021, 8:46 AM

## 2021-07-03 NOTE — Anesthesia Procedure Notes (Signed)
Procedure Name: MAC Date/Time: 07/03/2021 9:18 AM Performed by: Signe Colt, CRNA Pre-anesthesia Checklist: Patient identified, Emergency Drugs available, Suction available, Timeout performed and Patient being monitored Patient Re-evaluated:Patient Re-evaluated prior to induction Oxygen Delivery Method: Simple face mask

## 2021-07-03 NOTE — Op Note (Signed)
07/03/2021  9:50 AM  PATIENT:  Laura Pollard  81 y.o. female  PRE-OPERATIVE DIAGNOSIS:  right 2nd toe mucous cyst  POST-OPERATIVE DIAGNOSIS: Same  Procedure(s): 1.  Excision of right second toe mucous cyst 2.  Right second toe local soft tissue rearrangement with rotational flap  SURGEON:  Wylene Simmer, MD  ASSISTANT: None  ANESTHESIA:   Local, MAC  EBL:  minimal   TOURNIQUET:   Total Tourniquet Time Documented: area (Right) - 12 minutes Total: area (Right) - 12 minutes  COMPLICATIONS:  None apparent  DISPOSITION:  Extubated, awake and stable to recovery.  INDICATION FOR PROCEDURE: The patient is an 81 year old female who has a several year history of a painful mass at the right foot second toe.  It is gradually getting larger and interfering with shoewear.  She was referred by dermatology for this mass.  It appears to be a mucous cyst.  She presents now for excision of the mass and rotational flap coverage.  The risks and benefits of the alternative treatment options have been discussed in detail.  The patient wishes to proceed with surgery and specifically understands risks of bleeding, infection, nerve damage, blood clots, need for additional surgery, amputation and death.   PROCEDURE IN DETAIL: After preoperative consent was obtained and the correct operative site was identified, the patient was brought to the operating room and placed supine on the operating table.  IV sedation was administered along with preoperative antibiotics.  A preoperative timeout was taken.  A digital block was performed with half percent Marcaine with epinephrine at the right foot second toe.  The right lower extremity was then prepped and draped in standard sterile fashion.  The foot was exsanguinated and an Esmarch tourniquet wrapped around the ankle.  A flap incision was marked on the skin with an ellipsoid component going around the mass at the dorsal medial aspect of the right second toe DIP  joint.  Incision was made in carried down through the skin.  The mass was excised and passed off the field as a specimen to pathology.  The flap was then elevated over the dorsal extensor mechanism.  The wound was irrigated copiously.  The flap was rotated into the defect distally.  A corner stitch of 3-0 nylon was placed to hold the flap in the ellipsoid defect.  The toe was held in an extended position while a corner stitch was placed at the proximal corner of the flap.  Horizontal mattress sutures were used to repair the incision at the 3 side of the flap.  The tourniquet was released, and the flap was noted to be well perfused.  Sterile dressings were applied followed by compression wrap.  The patient was awakened from anesthesia and transported to the recovery room in stable condition.   FOLLOW UP PLAN: Weightbearing as tolerated in a flat postop shoe.  Follow-up in the office in 2 weeks for suture removal.  No indication for DVT prophylaxis in this ambulatory patient.

## 2021-07-03 NOTE — Anesthesia Preprocedure Evaluation (Addendum)
Anesthesia Evaluation  Patient identified by MRN, date of birth, ID band Patient awake    Reviewed: Allergy & Precautions, H&P , NPO status , Patient's Chart, lab work & pertinent test results  Airway Mallampati: II  TM Distance: >3 FB Neck ROM: Full    Dental no notable dental hx. (+) Teeth Intact, Dental Advisory Given   Pulmonary former smoker,    Pulmonary exam normal breath sounds clear to auscultation       Cardiovascular + Valvular Problems/Murmurs MVP  Rhythm:Regular Rate:Normal     Neuro/Psych  Headaches, Anxiety Depression TIA   GI/Hepatic negative GI ROS, Neg liver ROS,   Endo/Other  negative endocrine ROS  Renal/GU negative Renal ROS  negative genitourinary   Musculoskeletal  (+) Arthritis , Osteoarthritis,    Abdominal   Peds  Hematology negative hematology ROS (+)   Anesthesia Other Findings   Reproductive/Obstetrics negative OB ROS                            Anesthesia Physical Anesthesia Plan  ASA: 2  Anesthesia Plan: MAC   Post-op Pain Management: Tylenol PO (pre-op)* and Minimal or no pain anticipated   Induction: Intravenous  PONV Risk Score and Plan: 3 and Propofol infusion, Ondansetron and Dexamethasone  Airway Management Planned: Natural Airway and Simple Face Mask  Additional Equipment:   Intra-op Plan:   Post-operative Plan:   Informed Consent: I have reviewed the patients History and Physical, chart, labs and discussed the procedure including the risks, benefits and alternatives for the proposed anesthesia with the patient or authorized representative who has indicated his/her understanding and acceptance.     Dental advisory given  Plan Discussed with: CRNA  Anesthesia Plan Comments:         Anesthesia Quick Evaluation

## 2021-07-03 NOTE — Transfer of Care (Signed)
Immediate Anesthesia Transfer of Care Note  Patient: Laura Pollard  Procedure(s) Performed: Right 2nd toe mass excision; possible soft tissue rearrangement (Right)  Patient Location: PACU  Anesthesia Type:MAC  Level of Consciousness: awake, alert , oriented and patient cooperative  Airway & Oxygen Therapy: Patient Spontanous Breathing and Patient connected to face mask oxygen  Post-op Assessment: Report given to RN and Post -op Vital signs reviewed and stable  Post vital signs: Reviewed and stable  Last Vitals:  Vitals Value Taken Time  BP    Temp    Pulse 58 07/03/21 0938  Resp    SpO2 95 % 07/03/21 0938  Vitals shown include unvalidated device data.  Last Pain:  Vitals:   07/03/21 0806  TempSrc: Oral  PainSc: 0-No pain      Patients Stated Pain Goal: 4 (74/12/87 8676)  Complications: No notable events documented.

## 2021-07-03 NOTE — Anesthesia Postprocedure Evaluation (Signed)
Anesthesia Post Note  Patient: Lavella Lemons  Procedure(s) Performed: Right 2nd toe mass excision; possible soft tissue rearrangement (Right)     Patient location during evaluation: PACU Anesthesia Type: MAC Level of consciousness: awake and alert Pain management: pain level controlled Vital Signs Assessment: post-procedure vital signs reviewed and stable Respiratory status: spontaneous breathing, nonlabored ventilation and respiratory function stable Cardiovascular status: stable and blood pressure returned to baseline Postop Assessment: no apparent nausea or vomiting Anesthetic complications: no   No notable events documented.  Last Vitals:  Vitals:   07/03/21 1000 07/03/21 1009  BP:  (!) 136/56  Pulse: 64 69  Resp: 19 18  Temp:  36.4 C  SpO2: 96% 95%    Last Pain:  Vitals:   07/03/21 1009  TempSrc: Oral  PainSc: 0-No pain                 Nadeem Romanoski,W. EDMOND

## 2021-07-03 NOTE — Discharge Instructions (Addendum)
Wylene Simmer, MD [facility]  Please read the following information regarding your care after surgery.  Medications  X acetominophen (Tylenol) 650 mg every 4-6 hours as you need for pain  Weight Bearing X Bear weight only on your operated foot in the post-op shoe.  Cast / Splint / Dressing X Keep your splint, cast or dressing clean and dry.  Dont put anything (coat hanger, pencil, etc) down inside of it.  If it gets damp, use a hair dryer on the cool setting to dry it.  If it gets soaked, call the office to schedule an appointment for a cast change.  After your dressing, cast or splint is removed; you may shower, but do not soak or scrub the wound.  Allow the water to run over it, and then gently pat it dry.  Swelling It is normal for you to have swelling where you had surgery.  To reduce swelling and pain, keep your toes above your nose for at least 3 days after surgery.  It may be necessary to keep your foot or leg elevated for several weeks.  If it hurts, it should be elevated.  Follow Up Call my office at [phone] when you are discharged from the hospital or surgery center to schedule an appointment to be seen two weeks after surgery.  Call my office at [phone] if you develop a fever >101.5 F, nausea, vomiting, bleeding from the surgical site or severe pain.     Post Anesthesia Home Care Instructions  Activity: Get plenty of rest for the remainder of the day. A responsible individual must stay with you for 24 hours following the procedure.  For the next 24 hours, DO NOT: -Drive a car -Paediatric nurse -Drink alcoholic beverages -Take any medication unless instructed by your physician -Make any legal decisions or sign important papers.  Meals: Start with liquid foods such as gelatin or soup. Progress to regular foods as tolerated. Avoid greasy, spicy, heavy foods. If nausea and/or vomiting occur, drink only clear liquids until the nausea and/or vomiting subsides.  Call your physician if vomiting continues.  Special Instructions/Symptoms: Your throat may feel dry or sore from the anesthesia or the breathing tube placed in your throat during surgery. If this causes discomfort, gargle with warm salt water. The discomfort should disappear within 24 hours.  Next dose of Tylenol can be taken today at 2pm.

## 2021-07-04 ENCOUNTER — Encounter (HOSPITAL_BASED_OUTPATIENT_CLINIC_OR_DEPARTMENT_OTHER): Payer: Self-pay | Admitting: Orthopedic Surgery

## 2021-07-04 LAB — SURGICAL PATHOLOGY

## 2021-07-07 DIAGNOSIS — E785 Hyperlipidemia, unspecified: Secondary | ICD-10-CM | POA: Diagnosis not present

## 2021-07-07 DIAGNOSIS — F0390 Unspecified dementia without behavioral disturbance: Secondary | ICD-10-CM | POA: Diagnosis not present

## 2021-07-09 ENCOUNTER — Encounter: Payer: Self-pay | Admitting: Diagnostic Neuroimaging

## 2021-07-09 ENCOUNTER — Ambulatory Visit: Payer: PPO | Admitting: Diagnostic Neuroimaging

## 2021-07-09 VITALS — BP 139/78 | HR 75 | Ht 67.0 in | Wt 143.6 lb

## 2021-07-09 DIAGNOSIS — R413 Other amnesia: Secondary | ICD-10-CM | POA: Diagnosis not present

## 2021-07-09 NOTE — Progress Notes (Signed)
? ?[facility] ? ?PATIENT: Laura Pollard ?DOB: 12/13/1940 ? ?REFERRING CLINICIAN: Kathyrn Lass, MD ?HISTORY FROM: patient  ?REASON FOR VISIT: follow up ? ? ?HISTORICAL ? ?CHIEF COMPLAINT:  ?Chief Complaint  ?Patient presents with  ? Gait Problem  ?  Rm 6, 6 month FU, TOC Dr Jannifer Franklin  ? Memory Loss  ?  MMSE 23  ? ? ?HISTORY OF PRESENT ILLNESS:  ? ?UPDATE (07/09/21, [facility]): Since last visit, continues with memory loss. High stress levels, anxiety, depression, related to caregiver stress and husband's parkinson's disease. ? ?PRIOR HPI (01/06/21, Dr. Jannifer Franklin): [name] is a 81 year old right-handed white female with a history of a mild memory disturbance.  The patient is under a lot of stress taking care of her husband who has medical problems including Parkinson's disease.  The patient has had ongoing dizziness, she was asked to drop the Namenda dose to 5 mg twice daily but never did, she continues to take 10 mg twice daily.  The patient has not had any falls but she remains quite dizzy.  The patient over the last years had some painful itching sensation in the medial aspect of her right shoulder blade.  She does have some mild neck pain without pain down the arms.  She has been seen through a dermatologist, topical medications have not been effective.  The patient is very concerned about her memory.  She does use an Exelon patch as well. ? ? ?REVIEW OF SYSTEMS: Full 14 system review of systems performed and negative with exception of: as per HPI. ? ?ALLERGIES: ?Allergies  ?Allergen Reactions  ? Gabapentin   ?  Dizziness ?  ? Indocin [Indomethacin] Other (See Comments)  ?  Nausea and vomiting ?  ? Keflex [Cephalexin] Other (See Comments)  ?  unknown reaction ?  ? Lipitor [Atorvastatin] Other (See Comments)  ?  Muscle aches ?  ? Pravastatin Other (See Comments)  ?  Muscle aches ?  ? Tape Rash  ?  PAPER TAPE --- RASH  ? ? ?HOME MEDICATIONS: ?Outpatient Medications Prior to Visit  ?Medication Sig  Dispense Refill  ? ALPRAZolam (XANAX) 0.5 MG tablet Take 0.5 mg by mouth at bedtime as needed for anxiety.    ? atorvastatin (LIPITOR) 10 MG tablet Take 10 mg by mouth daily.    ? cholecalciferol (VITAMIN D) 1000 UNITS tablet Take 2,000 Units by mouth daily.    ? FLUoxetine (PROZAC) 20 MG tablet Take 20 mg by mouth daily.    ? memantine (NAMENDA) 5 MG tablet Take 1 tablet BY MOUTH TWICE A DAY 360 tablet 1  ? rivastigmine (EXELON) 4.6 mg/24hr APPPLY 1 PATCH TO SKIN DAILY 90 patch 3  ? ?No facility-administered medications prior to visit.  ? ? ? ? ?PHYSICAL EXAM ? ?GENERAL EXAM/CONSTITUTIONAL: ?Vitals:  ?Vitals:  ? 07/09/21 1100  ?BP: 139/78  ?Pulse: 75  ?Weight: 143 lb 9.6 oz (65.1 kg)  ?Height: 5\' 7"  (1.702 m)  ? ?Body mass index is 22.49 kg/m?. ?Wt Readings from Last 3 Encounters:  ?07/09/21 143 lb 9.6 oz (65.1 kg)  ?07/03/21 143 lb 4.8 oz (65 kg)  ?01/06/21 146 lb 9.6 oz (66.5 kg)  ? ?Patient is in no distress; well developed, nourished and groomed; neck is supple ? ?CARDIOVASCULAR: ?Examination of carotid arteries is normal; no carotid bruits ?Regular rate and rhythm, no murmurs ?Examination of peripheral vascular system by observation and palpation is normal ? ?EYES: ?Ophthalmoscopic exam of optic discs and posterior segments is normal; no  papilledema or hemorrhages ?No results found. ? ?MUSCULOSKELETAL: ?Gait, strength, tone, movements noted in Neurologic exam below ? ?NEUROLOGIC: ?MENTAL STATUS:  ?MMSE - Mini Mental State Exam 07/09/2021 01/06/2021 12/18/2019  ?Orientation to time 4 4 5   ?Orientation to time comments - date?: 28th -  ?Orientation to Place 4 5 5   ?Registration 3 3 3   ?Attention/ Calculation 2 1 5   ?Attention/Calculation-comments - 93...82 -  ?Recall 2 2 3   ?Language- name 2 objects 2 2 2   ?Language- repeat 1 1 1   ?Language- follow 3 step command 3 3 3   ?Language- read & follow direction 1 1 1   ?Write a sentence 1 1 1   ?Copy design 1 1 1   ?Total score 24 24 30   ? ?awake, alert, oriented to person,  place and time ?recent and remote memory intact ?normal attention and concentration ?language fluent, comprehension intact, naming intact ?fund of knowledge appropriate ?Animal fluency testing 20 in minute ?Slightly anxious appearing. Pressured speech ? ?CRANIAL NERVE:  ?2nd, 3rd, 4th, 6th - pupils equal and reactive to light, visual fields full to confrontation, extraocular muscles intact, no nystagmus ?5th - facial sensation symmetric ?7th - facial strength symmetric ?8th - hearing intact ?9th - palate elevates symmetrically, uvula midline ?11th - shoulder shrug symmetric ?12th - tongue protrusion midline ? ?MOTOR:  ?normal bulk and tone, full strength in the BUE, BLE ? ?SENSORY:  ?normal and symmetric to light touch, temperature, vibration ? ?COORDINATION:  ?finger-nose-finger, fine finger movements normal ? ?REFLEXES:  ?deep tendon reflexes present and symmetric ? ?GAIT/STATION:  ?narrow based gait ? ? ? ? ?DIAGNOSTIC DATA (LABS, IMAGING, TESTING) ?- I reviewed patient records, labs, notes, testing and imaging myself where available. ? ?Lab Results  ?Component Value Date  ? WBC 7.1 11/14/2019  ? HGB 15.0 11/14/2019  ? HCT 44.0 11/14/2019  ? MCV 90.1 11/14/2019  ? PLT 177 11/14/2019  ? ?   ?Component Value Date/Time  ? NA 143 11/14/2019 1141  ? K 3.9 11/14/2019 1141  ? CL 102 11/14/2019 1141  ? CO2 25 11/14/2019 1132  ? GLUCOSE 148 (H) 11/14/2019 1141  ? BUN 16 11/14/2019 1141  ? CREATININE 0.60 11/14/2019 1141  ? CALCIUM 10.1 11/14/2019 1132  ? PROT 8.7 (H) 11/14/2019 1132  ? ALBUMIN 5.4 (H) 11/14/2019 1132  ? AST 38 11/14/2019 1132  ? ALT 44 11/14/2019 1132  ? ALKPHOS 81 11/14/2019 1132  ? BILITOT 1.6 (H) 11/14/2019 1132  ? GFRNONAA >60 11/14/2019 1132  ? GFRAA >60 11/14/2019 1132  ? ?Lab Results  ?Component Value Date  ? CHOL 185 03/13/2017  ? HDL 44 03/13/2017  ? LDLCALC 113 (H) 03/13/2017  ? TRIG 142 03/13/2017  ? CHOLHDL 4.2 03/13/2017  ? ?Lab Results  ?Component Value Date  ? HGBA1C 5.5 03/13/2017  ? ?Lab  Results  ?Component Value Date  ? KDXIPJAS50 1,633 (H) 09/14/2014  ? ?No results found for: TSH ? ?11/14/19 MRI brain  ?- No change since November 2018. Mild age related volume loss and ?chronic small-vessel change of the cerebral hemispheric white ?matter. 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 ? ? ?ASSESSMENT AND PLAN ? ?81 y.o. year old female here with: ? ? ?Dx: ? ?1. Memory loss   ? ? ?PLAN: ? ?MILD MEMORY LOSS (likely related to caregiver stress, anxiety and depression) ?- ok to stop exelon and namenda ?- provided caregiver resources ?- continue psychiatry /  psychology ? ?Return for return to PCP. ? ?I spent 25 minutes of face-to-face and non-face-to-face time with patient.  This included previsit chart review, lab review, study review, order entry, electronic health record documentation, patient education.   ? ? ? ?Penni Bombard, MD 12/15/1992, 12:90 AM ?Certified in Neurology, Neurophysiology and Neuroimaging ? ?[facility] ?[address], [address] ?[place], [place] [address] ?[phone] ?

## 2021-07-09 NOTE — Patient Instructions (Addendum)
MILD MEMORY LOSS (likely related to caregiver stress, anxiety and depression) ?- ok to stop exelon ?- provided caregiver resources ?- continue psychiatry / psychology ?

## 2021-08-13 DIAGNOSIS — F331 Major depressive disorder, recurrent, moderate: Secondary | ICD-10-CM | POA: Diagnosis not present

## 2021-08-13 DIAGNOSIS — F411 Generalized anxiety disorder: Secondary | ICD-10-CM | POA: Diagnosis not present

## 2021-09-15 DIAGNOSIS — K5900 Constipation, unspecified: Secondary | ICD-10-CM | POA: Diagnosis not present

## 2021-09-15 DIAGNOSIS — Z1211 Encounter for screening for malignant neoplasm of colon: Secondary | ICD-10-CM | POA: Diagnosis not present

## 2021-09-15 DIAGNOSIS — R195 Other fecal abnormalities: Secondary | ICD-10-CM | POA: Diagnosis not present

## 2021-09-15 DIAGNOSIS — R152 Fecal urgency: Secondary | ICD-10-CM | POA: Diagnosis not present

## 2021-09-29 DIAGNOSIS — F331 Major depressive disorder, recurrent, moderate: Secondary | ICD-10-CM | POA: Diagnosis not present

## 2021-09-29 DIAGNOSIS — F411 Generalized anxiety disorder: Secondary | ICD-10-CM | POA: Diagnosis not present

## 2021-10-13 DIAGNOSIS — R2241 Localized swelling, mass and lump, right lower limb: Secondary | ICD-10-CM | POA: Diagnosis not present

## 2021-10-20 ENCOUNTER — Telehealth: Payer: Self-pay | Admitting: Diagnostic Neuroimaging

## 2021-10-20 NOTE — Telephone Encounter (Signed)
Pt is asking for a call from RN to discuss going back on memory medication that Dr Jannifer Franklin had her on.  Pt states Dr Leta Baptist took her off of the medication but she is very much wanting to go back on it, pt does not recall the name of the medication.

## 2021-10-20 NOTE — Telephone Encounter (Signed)
I called pt. No answer, left a message asking pt to call me back.   

## 2021-10-23 DIAGNOSIS — F331 Major depressive disorder, recurrent, moderate: Secondary | ICD-10-CM | POA: Diagnosis not present

## 2021-10-28 NOTE — Telephone Encounter (Signed)
Patient called back and stated her husband and people are telling her they are noticing a difference in her, more forgetful, losing htings. She feels she was doing better when taking memantine and exelon patch and would like to go back on those medicines. I informed her Dr Leta Baptist had released her to her PCP, and she may need to get from PCP or follow up here at least yearly. She stated she hardly ever sees PCP and wants to FU here. I advised her will send her request to Dr Leta Baptist and let her know Patient verbalized understanding, appreciation.

## 2021-10-29 MED ORDER — RIVASTIGMINE 4.6 MG/24HR TD PT24: 4.6000 mg | MEDICATED_PATCH | Freq: Every day | TRANSDERMAL | 12 refills | Status: DC

## 2021-10-29 MED ORDER — MEMANTINE HCL 10 MG PO TABS: 10.0000 mg | ORAL_TABLET | Freq: Two times a day (BID) | ORAL | 12 refills | Status: DC

## 2021-10-29 NOTE — Telephone Encounter (Signed)
Called patient and reviewed Dr Gladstone Lighter new Rx with her. She repeated correctly , verbalized understanding, appreciation.

## 2021-10-29 NOTE — Telephone Encounter (Signed)
-   start memantine '10mg'$  at bedtime; increase to twice a day after 1-2 weeks - start exelon patch  Meds ordered this encounter  Medications   rivastigmine (EXELON) 4.6 mg/24hr    Sig: Place 1 patch (4.6 mg total) onto the skin daily.    Dispense:  30 patch    Refill:  12   memantine (NAMENDA) 10 MG tablet    Sig: Take 1 tablet (10 mg total) by mouth 2 (two) times daily.    Dispense:  60 tablet    Refill:  [place], MD 01/20/2582, 4:62 PM Certified in Neurology, Neurophysiology and Neuroimaging  [facility] [address], [place], [place] [address] [phone]

## 2021-10-29 NOTE — Addendum Note (Signed)
Addended by: Florian Buff C on: 10/29/2021 10:50 AM   Modules accepted: Orders

## 2021-10-29 NOTE — Addendum Note (Signed)
Addended by: Andrey Spearman R on: 10/29/2021 02:17 PM   Modules accepted: Orders

## 2021-10-29 NOTE — Telephone Encounter (Signed)
Called patient , phone continuously rang, no VM. Will call back later.

## 2021-11-17 DIAGNOSIS — S93491A Sprain of other ligament of right ankle, initial encounter: Secondary | ICD-10-CM | POA: Diagnosis not present

## 2021-11-21 ENCOUNTER — Other Ambulatory Visit: Payer: Self-pay | Admitting: Diagnostic Neuroimaging

## 2021-12-02 DIAGNOSIS — E559 Vitamin D deficiency, unspecified: Secondary | ICD-10-CM | POA: Diagnosis not present

## 2021-12-02 DIAGNOSIS — R5383 Other fatigue: Secondary | ICD-10-CM | POA: Diagnosis not present

## 2021-12-02 DIAGNOSIS — M810 Age-related osteoporosis without current pathological fracture: Secondary | ICD-10-CM | POA: Diagnosis not present

## 2021-12-02 DIAGNOSIS — F411 Generalized anxiety disorder: Secondary | ICD-10-CM | POA: Diagnosis not present

## 2021-12-02 DIAGNOSIS — F410 Panic disorder [episodic paroxysmal anxiety] without agoraphobia: Secondary | ICD-10-CM | POA: Diagnosis not present

## 2021-12-02 DIAGNOSIS — S82454A Nondisplaced comminuted fracture of shaft of right fibula, initial encounter for closed fracture: Secondary | ICD-10-CM | POA: Diagnosis not present

## 2021-12-02 DIAGNOSIS — F331 Major depressive disorder, recurrent, moderate: Secondary | ICD-10-CM | POA: Diagnosis not present

## 2021-12-03 DIAGNOSIS — Z780 Asymptomatic menopausal state: Secondary | ICD-10-CM | POA: Diagnosis not present

## 2021-12-23 DIAGNOSIS — S82454D Nondisplaced comminuted fracture of shaft of right fibula, subsequent encounter for closed fracture with routine healing: Secondary | ICD-10-CM | POA: Diagnosis not present

## 2021-12-31 DIAGNOSIS — E785 Hyperlipidemia, unspecified: Secondary | ICD-10-CM | POA: Diagnosis not present

## 2021-12-31 DIAGNOSIS — F0390 Unspecified dementia without behavioral disturbance: Secondary | ICD-10-CM | POA: Diagnosis not present

## 2022-01-01 DIAGNOSIS — F331 Major depressive disorder, recurrent, moderate: Secondary | ICD-10-CM | POA: Diagnosis not present

## 2022-01-06 DIAGNOSIS — F331 Major depressive disorder, recurrent, moderate: Secondary | ICD-10-CM | POA: Diagnosis not present

## 2022-01-06 DIAGNOSIS — F411 Generalized anxiety disorder: Secondary | ICD-10-CM | POA: Diagnosis not present

## 2022-01-06 DIAGNOSIS — F410 Panic disorder [episodic paroxysmal anxiety] without agoraphobia: Secondary | ICD-10-CM | POA: Diagnosis not present

## 2022-01-08 ENCOUNTER — Ambulatory Visit (INDEPENDENT_AMBULATORY_CARE_PROVIDER_SITE_OTHER): Payer: PPO

## 2022-01-08 ENCOUNTER — Ambulatory Visit: Payer: PPO | Admitting: Orthopaedic Surgery

## 2022-01-08 DIAGNOSIS — M25571 Pain in right ankle and joints of right foot: Secondary | ICD-10-CM

## 2022-01-08 NOTE — Progress Notes (Signed)
Office Visit Note   Patient: Laura Pollard           Date of Birth: Apr 01, 1941           MRN: [id number] Visit Date: 01/08/2022              Requested by: Kathyrn Lass, [place],  [place] [address] PCP: Kathyrn Lass, MD   Assessment & Plan: Visit Diagnoses:  1. Pain in right ankle and joints of right foot     Plan: Impression is right ankle swelling and pain.  Overall the symptoms are fairly mild.  Reassurance provided that the discoloration is expected from the previous bruising and hematoma.  She is ambulating and getting around in normal shoes quite well.  We will just continue with symptomatic treatment for now and I would expect full resolution.  Follow-up as needed.  Follow-Up Instructions: No follow-ups on file.   Orders:  Orders Placed This Encounter  Procedures   XR Ankle Complete Right   No orders of the defined types were placed in this encounter.     Procedures: No procedures performed   Clinical Data: No additional findings.   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  She fell at that time was originally seen at [facility] urgent care.  Was told that she had a hairline fracture.  Continues to have swelling and skin discoloration.  She is able to wear normal shoe.  Originally was placed in a cam boot and then wean to Aircast.  Review of Systems  Constitutional: Negative.   HENT: Negative.    Eyes: Negative.   Respiratory: Negative.    Cardiovascular: Negative.   Endocrine: Negative.   Musculoskeletal: Negative.   Neurological: Negative.   Hematological: Negative.   Psychiatric/Behavioral: Negative.    All other systems reviewed and are negative.    Objective: Vital Signs: There were no vitals taken for this visit.  Physical Exam Vitals and nursing note  reviewed.  Constitutional:      Appearance: She is well-developed.  HENT:     Head: Atraumatic.     Nose: Nose normal.  Eyes:     Extraocular Movements: Extraocular movements intact.  Cardiovascular:     Pulses: Normal pulses.  Pulmonary:     Effort: Pulmonary effort is normal.  Abdominal:     Palpations: Abdomen is soft.  Musculoskeletal:     Cervical back: Neck supple.  Skin:    General: Skin is warm.     Capillary Refill: Capillary refill takes less than 2 seconds.  Neurological:     Mental Status: She is alert. Mental status is at baseline.  Psychiatric:        Behavior: Behavior normal.        Thought Content: Thought content normal.        Judgment: Judgment normal.     Ortho Exam Examination of the right ankle shows some dark discoloration of the skin.  There is mild swelling around the ankle.  No significant bony tenderness.  Range of motion is normal.  Motor or sensory function intact. Specialty Comments:  No specialty comments available.  Imaging: XR Ankle Complete Right  Result Date: 01/08/2022 Generalized osteopenia.  Small calcifications near the medial malleolus.  No acute abnormalities.    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   Headache    Hypercholesteremia    Hyperlipidemia    Liver disease    Memory difficulties 02/05/2015    Multiple allergies    MVP (mitral valve prolapse)    Nocturnal leg cramps 09/14/2014   Post-concussion vertigo 04/27/2016   Scoliosis    Stroke ([place])    tia no deficits    Family History  Problem Relation Age of Onset   Heart disease Mother    CAD Mother    AAA (abdominal aortic aneurysm) Father    Bladder Cancer Father    Kidney cancer Sister        metastasis to lung   Breast cancer Maternal Aunt 24   Breast cancer Cousin    HIV Son     Past Surgical History:  Procedure Laterality Date   CARDIAC CATHETERIZATION  2010   normal   cataract surgery     CESAREAN SECTION     COLONOSCOPY     ETT  2003   also ECHO..normal   MASS EXCISION Right 07/03/2021   Procedure: Right 2nd toe mass excision; possible soft tissue rearrangement;  Surgeon: Wylene Simmer, MD;  Location: [place];  Service: Orthopedics;  Laterality: Right;  41mn   OVARIAN CYST SURGERY     RETINAL DETACHMENT SURGERY Right 10 years ago   TONSILLECTOMY     Social History   Occupational History   Not on file  Tobacco Use   Smoking status: Former    Types: Cigarettes    Quit date: 05/11/1966    Years since quitting: 55.7   Smokeless tobacco: Never  Substance and Sexual Activity   Alcohol use: Not Currently    Comment: rarely   Drug use: No   Sexual activity: Not on file

## 2022-01-13 DIAGNOSIS — R220 Localized swelling, mass and lump, head: Secondary | ICD-10-CM | POA: Diagnosis not present

## 2022-01-13 DIAGNOSIS — R413 Other amnesia: Secondary | ICD-10-CM | POA: Diagnosis not present

## 2022-01-13 DIAGNOSIS — R432 Parageusia: Secondary | ICD-10-CM | POA: Diagnosis not present

## 2022-01-26 DIAGNOSIS — F411 Generalized anxiety disorder: Secondary | ICD-10-CM | POA: Diagnosis not present

## 2022-01-26 DIAGNOSIS — F331 Major depressive disorder, recurrent, moderate: Secondary | ICD-10-CM | POA: Diagnosis not present

## 2022-01-30 DIAGNOSIS — S93401A Sprain of unspecified ligament of right ankle, initial encounter: Secondary | ICD-10-CM | POA: Diagnosis not present

## 2022-01-30 DIAGNOSIS — M25571 Pain in right ankle and joints of right foot: Secondary | ICD-10-CM | POA: Diagnosis not present

## 2022-02-09 DIAGNOSIS — M25571 Pain in right ankle and joints of right foot: Secondary | ICD-10-CM | POA: Diagnosis not present

## 2022-03-12 DIAGNOSIS — F331 Major depressive disorder, recurrent, moderate: Secondary | ICD-10-CM | POA: Diagnosis not present

## 2022-03-18 DIAGNOSIS — F331 Major depressive disorder, recurrent, moderate: Secondary | ICD-10-CM | POA: Diagnosis not present

## 2022-03-18 DIAGNOSIS — F411 Generalized anxiety disorder: Secondary | ICD-10-CM | POA: Diagnosis not present

## 2022-03-18 DIAGNOSIS — F410 Panic disorder [episodic paroxysmal anxiety] without agoraphobia: Secondary | ICD-10-CM | POA: Diagnosis not present

## 2022-03-19 DIAGNOSIS — K148 Other diseases of tongue: Secondary | ICD-10-CM | POA: Diagnosis not present

## 2022-04-20 DIAGNOSIS — F411 Generalized anxiety disorder: Secondary | ICD-10-CM | POA: Diagnosis not present

## 2022-04-20 DIAGNOSIS — F331 Major depressive disorder, recurrent, moderate: Secondary | ICD-10-CM | POA: Diagnosis not present

## 2022-04-29 DIAGNOSIS — Z0000 Encounter for general adult medical examination without abnormal findings: Secondary | ICD-10-CM | POA: Diagnosis not present

## 2022-04-29 DIAGNOSIS — Z1389 Encounter for screening for other disorder: Secondary | ICD-10-CM | POA: Diagnosis not present

## 2022-04-29 DIAGNOSIS — Z6820 Body mass index (BMI) 20.0-20.9, adult: Secondary | ICD-10-CM | POA: Diagnosis not present

## 2022-04-30 ENCOUNTER — Other Ambulatory Visit: Payer: Self-pay | Admitting: Family Medicine

## 2022-04-30 DIAGNOSIS — Z1231 Encounter for screening mammogram for malignant neoplasm of breast: Secondary | ICD-10-CM

## 2022-05-06 DIAGNOSIS — R413 Other amnesia: Secondary | ICD-10-CM | POA: Diagnosis not present

## 2022-05-06 DIAGNOSIS — F325 Major depressive disorder, single episode, in full remission: Secondary | ICD-10-CM | POA: Diagnosis not present

## 2022-05-06 DIAGNOSIS — Z6820 Body mass index (BMI) 20.0-20.9, adult: Secondary | ICD-10-CM | POA: Diagnosis not present

## 2022-05-06 DIAGNOSIS — E785 Hyperlipidemia, unspecified: Secondary | ICD-10-CM | POA: Diagnosis not present

## 2022-06-03 DIAGNOSIS — F331 Major depressive disorder, recurrent, moderate: Secondary | ICD-10-CM | POA: Diagnosis not present

## 2022-06-03 DIAGNOSIS — F411 Generalized anxiety disorder: Secondary | ICD-10-CM | POA: Diagnosis not present

## 2022-06-17 DIAGNOSIS — F331 Major depressive disorder, recurrent, moderate: Secondary | ICD-10-CM | POA: Diagnosis not present

## 2022-06-25 ENCOUNTER — Ambulatory Visit
Admission: RE | Admit: 2022-06-25 | Discharge: 2022-06-25 | Disposition: A | Discharge status: Home / Self Care | Payer: PPO | Source: Ambulatory Visit | Attending: Family Medicine | Admitting: Family Medicine

## 2022-06-25 DIAGNOSIS — Z1231 Encounter for screening mammogram for malignant neoplasm of breast: Secondary | ICD-10-CM | POA: Diagnosis not present

## 2022-08-17 DIAGNOSIS — F331 Major depressive disorder, recurrent, moderate: Secondary | ICD-10-CM | POA: Diagnosis not present

## 2022-09-16 DIAGNOSIS — E782 Mixed hyperlipidemia: Secondary | ICD-10-CM | POA: Diagnosis not present

## 2022-09-16 DIAGNOSIS — E785 Hyperlipidemia, unspecified: Secondary | ICD-10-CM | POA: Diagnosis not present

## 2022-09-16 DIAGNOSIS — F325 Major depressive disorder, single episode, in full remission: Secondary | ICD-10-CM | POA: Diagnosis not present

## 2022-10-07 DIAGNOSIS — Z79899 Other long term (current) drug therapy: Secondary | ICD-10-CM | POA: Diagnosis not present

## 2022-10-07 DIAGNOSIS — L237 Allergic contact dermatitis due to plants, except food: Secondary | ICD-10-CM | POA: Diagnosis not present

## 2022-11-04 DIAGNOSIS — Z6820 Body mass index (BMI) 20.0-20.9, adult: Secondary | ICD-10-CM | POA: Diagnosis not present

## 2022-11-04 DIAGNOSIS — R634 Abnormal weight loss: Secondary | ICD-10-CM | POA: Diagnosis not present

## 2022-11-04 DIAGNOSIS — I700 Atherosclerosis of aorta: Secondary | ICD-10-CM | POA: Diagnosis not present

## 2022-11-04 DIAGNOSIS — F324 Major depressive disorder, single episode, in partial remission: Secondary | ICD-10-CM | POA: Diagnosis not present

## 2022-11-04 DIAGNOSIS — R413 Other amnesia: Secondary | ICD-10-CM | POA: Diagnosis not present

## 2022-11-04 DIAGNOSIS — F0390 Unspecified dementia without behavioral disturbance: Secondary | ICD-10-CM | POA: Diagnosis not present

## 2022-11-04 DIAGNOSIS — E785 Hyperlipidemia, unspecified: Secondary | ICD-10-CM | POA: Diagnosis not present

## 2022-11-16 DIAGNOSIS — F331 Major depressive disorder, recurrent, moderate: Secondary | ICD-10-CM | POA: Diagnosis not present

## 2022-11-19 ENCOUNTER — Telehealth: Payer: Self-pay | Admitting: Neurology

## 2022-11-19 NOTE — Telephone Encounter (Signed)
Laura Pollard with [facility] PCP (works with Dr Hyacinth Meeker) reached out states that Dr Hyacinth Meeker and Dr Donell Beers are concerned that the patient is potentially having decline in memory/cognitive function. 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 There has been several things that have come to their attention and they felt the patient would benefit with a neuro psychology testing. I advised that our office completes the basic memory testing such as MMSE which we did complete on the patient last year (24/30) I advised that the neuro psych referrals are booking out rather far and if they feel the patient needs that, it would be beneficial to go ahead and place the referral and get that process going.  In the meantime they would like for our office to lay eyes and see the pt. I will see if we can work on getting the patient on the schedule with Aundra Millet NP or MD.

## 2022-12-08 ENCOUNTER — Other Ambulatory Visit: Payer: Self-pay | Admitting: Diagnostic Neuroimaging

## 2022-12-31 ENCOUNTER — Encounter: Payer: Self-pay | Admitting: Psychology

## 2023-01-26 ENCOUNTER — Ambulatory Visit: Payer: PPO | Admitting: Adult Health

## 2023-01-26 ENCOUNTER — Encounter: Payer: Self-pay | Admitting: Adult Health

## 2023-01-26 VITALS — BP 129/72 | HR 77 | Ht 68.0 in | Wt 133.0 lb

## 2023-01-26 DIAGNOSIS — R413 Other amnesia: Secondary | ICD-10-CM | POA: Diagnosis not present

## 2023-01-26 NOTE — Progress Notes (Signed)
PATIENT: Laura Pollard DOB: 01/03/1941  REASON FOR VISIT: follow up HISTORY FROM: patient PRIMARY NEUROLOGIST: Dr. Marjory Lies  Chief Complaint  Patient presents with   Follow-up    Pt in 20  pt here for memory f/u Pt states short term memory is worse Pt states she is the caregiver for husband and she is tired  Pt states she has little help taking care of her husband Pt states stressed      HISTORY OF PRESENT ILLNESS: Today 01/26/23  Laura Pollard is a 82 y.o. female who has been followed in this office for memory loss. Returns today for follow-up.  Patient reports she has noticed a change in her short-term memory. She residense in her own resident. She is the primary caregiver for her husband who has Parkinson's.  She states that she does not have much help. She does have aid that comes when she leave.  At home she completes all ADLS independently. Operates a motor vehicle without difficulty. Not sleeping well because she has to get up with her husband. Reports that she thinks her husbands caregiver is stealing her items.   States that she is no longer on Namenda- not sure why  HISTORY UPDATE (07/09/21, VRP): Since last visit, continues with memory loss. High stress levels, anxiety, depression, related to caregiver stress and husband's parkinson's disease.   PRIOR HPI (01/06/21, Dr. Anne Hahn): Laura Pollard is a 82 year old right-handed white female with a history of a mild memory disturbance.  The patient is under a lot of stress taking care of her husband who has medical problems including Parkinson's disease.  The patient has had ongoing dizziness, she was asked to drop the Namenda dose to 5 mg twice daily but never did, she continues to take 10 mg twice daily.  The patient has not had any falls but she remains quite dizzy.  The patient over the last years had some painful itching sensation in the medial aspect of her right shoulder blade.  She does have some mild neck pain without  pain down the arms.  She has been seen through a dermatologist, topical medications have not been effective.  The patient is very concerned about her memory.  She does use an Exelon patch as well.    REVIEW OF SYSTEMS: Out of a complete 14 system review of symptoms, the patient complains only of the following symptoms, and all other reviewed systems are negative.  ALLERGIES: Allergies  Allergen Reactions   Gabapentin     Dizziness    Indocin [Indomethacin] Other (See Comments)    Nausea and vomiting    Keflex [Cephalexin] Other (See Comments)    unknown reaction    Lipitor [Atorvastatin] Other (See Comments)    Muscle aches    Pravastatin Other (See Comments)    Muscle aches    Tape Rash    PAPER TAPE --- RASH    HOME MEDICATIONS: Outpatient Medications Prior to Visit  Medication Sig Dispense Refill   ALPRAZolam (XANAX) 0.5 MG tablet Take 0.5 mg by mouth at bedtime as needed for anxiety.     atorvastatin (LIPITOR) 10 MG tablet Take 10 mg by mouth daily.     cholecalciferol (VITAMIN D) 1000 UNITS tablet Take 2,000 Units by mouth daily.     FLUoxetine (PROZAC) 20 MG tablet Take 20 mg by mouth daily.     memantine (NAMENDA) 10 MG tablet TAKE 1 TABLET BY MOUTH TWICE A DAY 180 tablet 3   rivastigmine (EXELON)  4.6 mg/24hr apply ONE PATCH onto THE SKIN daily. 30 patch 11   No facility-administered medications prior to visit.    PAST MEDICAL HISTORY: Past Medical History:  Diagnosis Date   Abnormal heart rhythm    Anxiety    Arthritis    Atypical chest pain    normal ETT/Echo in 2003.. 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   Scoliosis    Stroke (HCC)    tia no  deficits    PAST SURGICAL HISTORY: Past Surgical History:  Procedure Laterality Date   CARDIAC CATHETERIZATION  2010   normal   cataract surgery     CESAREAN SECTION     COLONOSCOPY     ETT  2003   also ECHO..normal   MASS EXCISION Right 07/03/2021   Procedure: Right 2nd toe mass excision; possible soft tissue rearrangement;  Surgeon: Toni Arthurs, MD;  Location: [facility];  Service: Orthopedics;  Laterality: Right;    OVARIAN CYST SURGERY     RETINAL DETACHMENT SURGERY Right 10 years ago   TONSILLECTOMY      FAMILY HISTORY: Family History  Problem Relation Age of Onset   Heart disease Mother    CAD Mother    AAA (abdominal aortic aneurysm) Father    Bladder Cancer Father    Kidney cancer Sister        metastasis to lung   Breast cancer Maternal Aunt 60   Breast cancer Cousin    HIV Son     SOCIAL HISTORY: Social History   Socioeconomic History   Marital status: Married    Spouse name: Not on file   Number of children: 2   Years of education: some coll.   Highest education level: Not on file  Occupational History   Not on file  Tobacco Use   Smoking status: Former    Current packs/day: 0.00    Types: Cigarettes    Quit date: 05/11/1966    Years since quitting: 56.7   Smokeless tobacco: Never  Substance and Sexual Activity   Alcohol use: Not Currently    Comment: rarely   Drug use: No   Sexual activity: Not on file  Other Topics Concern   Not on file  Social History Narrative   Patient is right handed.   Patient drinks 1-2 cups of caffeine daily.   Lives with husband (has parkinsons)   Social Determinants of Health   Financial Resource Strain: Low Risk  (04/25/2019)   Overall Financial Resource Strain (CARDIA)    Difficulty of Paying Living Expenses: Not hard at all  Food Insecurity: No Food Insecurity (04/25/2019)   Hunger Vital Sign    Worried About Running Out of Food in the Last Year: Never true    Ran Out of Food in the  Last Year: Never true  Transportation Needs: No Transportation Needs (04/25/2019)   PRAPARE - Administrator, Civil Service (Medical): No    Lack of Transportation (Non-Medical): No  Physical Activity: Inactive (04/25/2019)   Exercise Vital Sign    Days of Exercise per Week: 0 days    Minutes of Exercise per Session:  0 min  Stress: Stress Concern Present (04/25/2019)   [employer] of Occupational Health - Occupational Stress Questionnaire    Feeling of Stress : Rather much  Social Connections: Socially Isolated (04/25/2019)   Social Connection and Isolation Panel [NHANES]    Frequency of Communication with Friends and Family: Once a week    Frequency of Social Gatherings with Friends and Family: Never    Attends Religious Services: Never    Database administrator or Organizations: No    Attends Banker Meetings: Never    Marital Status: Married  Catering manager Violence: Not At Risk (04/25/2019)   Humiliation, Afraid, Rape, and Kick questionnaire    Fear of Current or Ex-Partner: No    Emotionally Abused: No    Physically Abused: No    Sexually Abused: No    PHYSICAL EXAM  Vitals:   01/26/23 1506  BP: 129/72  Pulse: 77  Weight: 133 lb (60.3 kg)  Height: 5\' 8"  (1.727 m)   Body mass index is 20.22 kg/m.     01/26/2023    3:08 PM 07/09/2021   11:06 AM 01/06/2021    8:00 AM  MMSE - Mini Mental State Exam  Orientation to time 2 4 4   Orientation to time comments   date?: 28th  Orientation to Place 4 4 5   Registration 3 3 3   Attention/ Calculation 3 2 1   Attention/Calculation-comments   93.Marland KitchenMarland Kitchen82  Recall 1 2 2   Language- name 2 objects 2 2 2   Language- repeat 1 1 1   Language- follow 3 step command 3 3 3   Language- read & follow direction 1 1 1   Write a sentence 1 1 1   Copy design 0 1 1  Total score 21 24 24      Generalized: Well developed, in no acute distress   Neurological examination  Mentation: Alert oriented to time, place,  history taking. Follows all commands speech and language fluent Cranial nerve II-XII: Pupils were equal round reactive to light. Extraocular movements were full, visual field were full on confrontational test. Facial sensation and strength were normal. Head turning and shoulder shrug  were normal and symmetric. Motor: The motor testing reveals 5 over 5 strength of all 4 extremities. Good symmetric motor tone is noted throughout.  Sensory: Sensory testing is intact to soft touch on all 4 extremities. No evidence of extinction is noted.  Coordination: Cerebellar testing reveals good finger-nose-finger and heel-to-shin bilaterally.  Gait and station: Gait is normal.   DIAGNOSTIC DATA (LABS, IMAGING, TESTING) - I reviewed patient records, labs, notes, testing and imaging myself where available.  Lab Results  Component Value Date   WBC 7.1 11/14/2019   HGB 15.0 11/14/2019   HCT 44.0 11/14/2019   MCV 90.1 11/14/2019   PLT 177 11/14/2019      Component Value Date/Time   NA 143 11/14/2019 1141   K 3.9 11/14/2019 1141   CL 102 11/14/2019 1141   CO2 25 11/14/2019 1132   GLUCOSE 148 (H) 11/14/2019 1141   BUN 16 11/14/2019 1141   CREATININE 0.60 11/14/2019 1141   CALCIUM 10.1 11/14/2019 1132   PROT 8.7 (H) 11/14/2019 1132   ALBUMIN 5.4 (H) 11/14/2019 1132   AST 38 11/14/2019 1132   ALT 44 11/14/2019 1132   ALKPHOS 81 11/14/2019 1132   BILITOT 1.6 (H) 11/14/2019 1132   GFRNONAA >60 11/14/2019 1132   GFRAA >60 11/14/2019 1132   Lab Results  Component Value Date   CHOL 185 03/13/2017  HDL 44 03/13/2017   LDLCALC 113 (H) 03/13/2017   TRIG 142 03/13/2017   CHOLHDL 4.2 03/13/2017   Lab Results  Component Value Date   HGBA1C 5.5 03/13/2017   Lab Results  Component Value Date   VITAMINB12 1,633 (H) 09/14/2014       ASSESSMENT AND PLAN 82 y.o. year old female  has a past medical history of Abnormal heart rhythm, Anxiety, Arthritis, Atypical chest pain, Concussion with loss of  consciousness (04/27/2016), Depression, Gait disorder (09/14/2014), Headache, Hypercholesteremia, Hyperlipidemia, Liver disease, Memory difficulties (02/05/2015), Multiple allergies, MVP (mitral valve prolapse), Nocturnal leg cramps (09/14/2014), Post-concussion vertigo (04/27/2016), Scoliosis, and Stroke (HCC). here with:  Memory disturbance  -  MMSE 21/30 previously 24/30 - Continue Exelon 4.6 mg patch every 24 hours - Advised to check at home to see if she is taking Namenda 10 mg BID - discussed resources for additional help at home.  - FU in 6-7 months or sooner if needed     Butch Penny, MSN, NP-C 01/26/2023, 2:45 PM [facility] [address] [place], [place] [address] [phone]

## 2023-01-26 NOTE — Patient Instructions (Signed)
Your Plan:  Continue Exelon patch Check at home to see if you are taking Namenda If your symptoms worsen or you develop new symptoms please let us know.    Thank you for coming to see [place] at [facility]. I hope we have been able to provide you high quality care today.  You may receive a patient satisfaction survey over the next few weeks. We would appreciate your feedback and comments so that we may continue to improve ourselves and the health of our patients.

## 2023-01-27 ENCOUNTER — Telehealth: Payer: Self-pay | Admitting: Adult Health

## 2023-01-27 NOTE — Telephone Encounter (Signed)
Pt said, can not remember new medication discussed at last office visit, pharmacy does not have a prescription. Would like a call back.

## 2023-01-28 MED ORDER — MEMANTINE HCL 5 MG PO TABS: 5.0000 mg | ORAL_TABLET | Freq: Two times a day (BID) | ORAL | 5 refills | Status: DC

## 2023-01-28 NOTE — Telephone Encounter (Signed)
Called and spoke to pt and stated ok to restart memantine 5mg  big as Butch Penny stated. Pt agreeable   Removed the 10mg  that was on list and sent 5mg  bid as provider recommended

## 2023-01-28 NOTE — Telephone Encounter (Signed)
Spoke to patient reviewed Megan,NP office note Per Megan,NP wanted to know if pt was taking her memantine.Pt states no . Will forward to Megan,NP to make her aware. Pt thanked me for calling

## 2023-01-28 NOTE — Telephone Encounter (Signed)
If she wants to restart can start Namenda 5 mg BID

## 2023-02-08 DIAGNOSIS — F331 Major depressive disorder, recurrent, moderate: Secondary | ICD-10-CM | POA: Diagnosis not present

## 2023-02-20 ENCOUNTER — Other Ambulatory Visit: Payer: Self-pay | Admitting: Adult Health

## 2023-04-19 DIAGNOSIS — F331 Major depressive disorder, recurrent, moderate: Secondary | ICD-10-CM | POA: Diagnosis not present

## 2023-04-30 DIAGNOSIS — Z6820 Body mass index (BMI) 20.0-20.9, adult: Secondary | ICD-10-CM | POA: Diagnosis not present

## 2023-04-30 DIAGNOSIS — Z23 Encounter for immunization: Secondary | ICD-10-CM | POA: Diagnosis not present

## 2023-04-30 DIAGNOSIS — E782 Mixed hyperlipidemia: Secondary | ICD-10-CM | POA: Diagnosis not present

## 2023-04-30 DIAGNOSIS — F0390 Unspecified dementia without behavioral disturbance: Secondary | ICD-10-CM | POA: Diagnosis not present

## 2023-06-24 ENCOUNTER — Other Ambulatory Visit: Payer: Self-pay | Admitting: Adult Health

## 2023-08-11 ENCOUNTER — Ambulatory Visit: Payer: PPO | Admitting: Adult Health

## 2023-08-11 ENCOUNTER — Encounter: Payer: Self-pay | Admitting: Adult Health

## 2023-09-24 ENCOUNTER — Other Ambulatory Visit: Payer: Self-pay | Admitting: Adult Health

## 2023-09-30 ENCOUNTER — Ambulatory Visit: Payer: PPO | Admitting: Adult Health

## 2023-10-21 ENCOUNTER — Encounter: Payer: PPO | Attending: Psychology | Admitting: Psychology

## 2023-11-08 DIAGNOSIS — F0390 Unspecified dementia without behavioral disturbance: Secondary | ICD-10-CM | POA: Diagnosis not present

## 2023-11-08 DIAGNOSIS — F411 Generalized anxiety disorder: Secondary | ICD-10-CM | POA: Diagnosis not present

## 2023-11-08 DIAGNOSIS — E782 Mixed hyperlipidemia: Secondary | ICD-10-CM | POA: Diagnosis not present

## 2023-11-08 DIAGNOSIS — E785 Hyperlipidemia, unspecified: Secondary | ICD-10-CM | POA: Diagnosis not present

## 2023-11-16 ENCOUNTER — Telehealth: Payer: Self-pay

## 2023-11-16 DIAGNOSIS — F0390 Unspecified dementia without behavioral disturbance: Secondary | ICD-10-CM

## 2023-12-08 ENCOUNTER — Telehealth: Payer: Self-pay | Admitting: *Deleted

## 2023-12-08 NOTE — Progress Notes (Signed)
 Complex Care Management Note  Care Guide Note 12/08/2023 Name: Laura Pollard MRN: [id number] DOB: 1941/01/25  Laura Pollard is a 83 y.o. year old female who sees Cleotilde Planas, MD for primary care. I reached out to Rolena A Syler and spouse Jamed by phone today to offer complex care management services.  [name] and [name]  was given information about Complex Care Management services today including:   The Complex Care Management services include support from the care team which includes your Nurse Care Manager, Clinical Social Worker, or Pharmacist.  The Complex Care Management team is here to help remove barriers to the health concerns and goals most important to you. Complex Care Management services are voluntary, and the patient may decline or stop services at any time by request to their care team member.   Complex Care Management Consent Status: Patient agreed to services and verbal consent obtained. And Spouse [name]  Follow up plan:  Telephone appointment with complex care management team member scheduled for:  12/17/23  Encounter Outcome:  Patient Scheduled  Harlene Satterfield  [facility]  [facility], [facility] Guide  Direct Dial: [phone]  Fax [phone]

## 2023-12-09 DIAGNOSIS — E782 Mixed hyperlipidemia: Secondary | ICD-10-CM | POA: Diagnosis not present

## 2023-12-09 DIAGNOSIS — F411 Generalized anxiety disorder: Secondary | ICD-10-CM | POA: Diagnosis not present

## 2023-12-09 DIAGNOSIS — E785 Hyperlipidemia, unspecified: Secondary | ICD-10-CM | POA: Diagnosis not present

## 2023-12-09 DIAGNOSIS — F0390 Unspecified dementia without behavioral disturbance: Secondary | ICD-10-CM | POA: Diagnosis not present

## 2023-12-17 ENCOUNTER — Other Ambulatory Visit: Payer: Self-pay | Admitting: Licensed Clinical Social Worker

## 2023-12-17 NOTE — Patient Outreach (Signed)
 CSW briefly spoke with patient's spouse to discuss CCM referral and offer services. Spouse requested were reschedule initial appointment - CSW agreed and rescheduled appt for 12/24/23 at 11AM.  Alm Armor, LCSW [place], [facility] Licensed Clinical Social Worker Care Coordinator [phone]

## 2023-12-18 ENCOUNTER — Other Ambulatory Visit: Payer: Self-pay | Admitting: Adult Health

## 2023-12-24 ENCOUNTER — Other Ambulatory Visit: Payer: Self-pay | Admitting: Licensed Clinical Social Worker

## 2023-12-24 NOTE — Patient Instructions (Signed)
 Visit Information  Thank you for taking time to visit with me today. Please don't hesitate to contact me if I can be of assistance to you before our next scheduled appointment.  Please call the care guide team at [phone] if you need to cancel or reschedule your appointment.   Following is a copy of your care plan:   Goals Addressed             This Visit's Progress    COMPLETED: [facility] Social Work Care Plan       Problems:   Level of Care Concerns:Facility placement (Memory Care)  CSW Clinical Goal(s):   Over the next 4 weeks the Patient will will follow up with local memory care facilities as directed by Social Work.  Interventions:  Level of Care Concerns in a patient with memory deficits Current level of care: Home with other family or significant other(s): spouse  Evaluation of patient's unmet needs in current living environment ADL's Assessed needs, level of care concerns, how currently meeting needs and barriers to care Problem [facility] Center strategies reviewed Currently receiving private pay care through in home aide agency  Facility  Assessed needs and reviewed facility placement process; as well as the different levels of care Discussed payment options for placement :Private pay Problem [facility] Center strategies reviewed Provided a list of facilities based on level of care needs Reviewed facility placement process :caregiver voiced confidence in being able to contact facilities for placement options  Patient Goals/Self-Care Activities:  Continue taking your medication as prescribed.   Review educational material on local facilities and admissions process.  Plan:   The patient has been provided with contact information for the care management team and has been advised to call with any health related questions or concerns.  Caregiver declined any further needs or follow up calls at this time. CSW encouraged caregiver to contact CSW with any  concerns        Please call the Suicide and Crisis Lifeline: 988 if you are experiencing a Mental Health or Behavioral Health Crisis or need someone to talk to.  Patient verbalizes understanding of instructions and care plan provided today and agrees to view in MyChart. Active MyChart status and patient understanding of how to access instructions and care plan via MyChart confirmed with patient.     Alm Armor, LCSW [place], [facility] Licensed Clinical Social Worker Care Coordinator [phone]

## 2023-12-24 NOTE — Patient Outreach (Signed)
 Complex Care Management   Visit Note  12/24/2023  Name:  Laura Pollard MRN: [id number] DOB: 05/13/1940  Situation: Referral received for Complex Care Management related to memory deficits I obtained verbal consent from Caregiver.  Visit completed with caregiver  on the phone  Background:   Past Medical History:  Diagnosis Date   Abnormal heart rhythm    Anxiety    Arthritis    Atypical chest pain    normal ETT/Echo in 2003.. 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   Scoliosis    Stroke (HCC)    tia no deficits    Assessment: Patient Reported Symptoms:  Cognitive Cognitive Status: Able to follow simple commands, Difficulties with attention and concentration, Requires Assistance Decision Making Cognitive/Intellectual Conditions Management [RPT]: Other Other: Memory Deficits   Health Maintenance Behaviors: None  Neurological Neurological Review of Symptoms: No symptoms reported Neurological Management Strategies: Activity, Routine screening  HEENT HEENT Symptoms Reported: No symptoms reported      Cardiovascular Cardiovascular Symptoms Reported: No symptoms reported    Respiratory Respiratory Symptoms Reported: No symptoms reported    Endocrine Endocrine Symptoms Reported: No symptoms reported    Gastrointestinal Gastrointestinal Symptoms Reported: No symptoms reported      Genitourinary Genitourinary Symptoms Reported: No symptoms reported    Integumentary Integumentary Symptoms Reported: No symptoms reported    Musculoskeletal Musculoskelatal Symptoms Reviewed: No symptoms reported        Psychosocial Psychosocial Symptoms Reported: No symptoms reported             04/25/2019   11:47 AM  Depression screen PHQ 2/9  Decreased Interest 0  Down, Depressed, Hopeless 0  PHQ - 2 Score 0    There were no vitals filed for this visit.  Medications Reviewed Today     Reviewed by Kit Alm DELENA, LCSW (Social Worker) on 12/24/23 at 1522  Med List Status: <None>   Medication Order Taking? Sig Documenting Provider Last Dose Status Informant  ALPRAZolam  (XANAX ) 0.5 MG tablet [id number] Yes Take 0.5 mg by mouth at bedtime as needed for anxiety. [provider]  Active   atorvastatin (LIPITOR) 10 MG tablet [id number] Yes Take 10 mg by mouth daily. [provider]  Active Self  cholecalciferol  (VITAMIN D ) 1000 UNITS tablet [id number] Yes Take 2,000 Units by mouth daily. [provider]  Active Self  FLUoxetine  (PROZAC ) 20 MG tablet [id number] Yes Take 20 mg by mouth daily. [provider]  Active Self  memantine  (NAMENDA ) 5 MG tablet [id number] Yes Take 1 tablet (5 mg total) by mouth 2 (two) times daily. Sherryl Bouchard, NP  Active   rivastigmine  (EXELON ) 4.6 mg/24hr [id number]  apply ONE PATCH onto THE SKIN daily. Penumalli, Vikram R, MD  Active             Recommendation:   Continue Current Plan of Care  Follow Up Plan:   Patient has met all care management goals. Care Management case will be closed. Patient has been provided contact information should new needs arise.   Alm Kit, LCSW [facility], [facility] Licensed Clinical Social Worker Care Coordinator [phone]

## 2024-01-05 ENCOUNTER — Other Ambulatory Visit: Payer: Self-pay | Admitting: Adult Health

## 2024-01-09 DIAGNOSIS — E785 Hyperlipidemia, unspecified: Secondary | ICD-10-CM | POA: Diagnosis not present

## 2024-01-09 DIAGNOSIS — F0390 Unspecified dementia without behavioral disturbance: Secondary | ICD-10-CM | POA: Diagnosis not present

## 2024-01-09 DIAGNOSIS — E782 Mixed hyperlipidemia: Secondary | ICD-10-CM | POA: Diagnosis not present

## 2024-01-09 DIAGNOSIS — F411 Generalized anxiety disorder: Secondary | ICD-10-CM | POA: Diagnosis not present

## 2024-01-17 ENCOUNTER — Other Ambulatory Visit: Payer: Self-pay | Admitting: Diagnostic Neuroimaging

## 2024-02-08 DIAGNOSIS — F411 Generalized anxiety disorder: Secondary | ICD-10-CM | POA: Diagnosis not present

## 2024-02-08 DIAGNOSIS — E782 Mixed hyperlipidemia: Secondary | ICD-10-CM | POA: Diagnosis not present

## 2024-02-08 DIAGNOSIS — F0390 Unspecified dementia without behavioral disturbance: Secondary | ICD-10-CM | POA: Diagnosis not present

## 2024-02-08 DIAGNOSIS — E785 Hyperlipidemia, unspecified: Secondary | ICD-10-CM | POA: Diagnosis not present

## 2024-03-10 DIAGNOSIS — F0390 Unspecified dementia without behavioral disturbance: Secondary | ICD-10-CM | POA: Diagnosis not present

## 2024-03-10 DIAGNOSIS — E782 Mixed hyperlipidemia: Secondary | ICD-10-CM | POA: Diagnosis not present

## 2024-03-10 DIAGNOSIS — E785 Hyperlipidemia, unspecified: Secondary | ICD-10-CM | POA: Diagnosis not present

## 2024-03-10 DIAGNOSIS — F411 Generalized anxiety disorder: Secondary | ICD-10-CM | POA: Diagnosis not present

## 2024-03-14 ENCOUNTER — Ambulatory Visit: Admitting: Adult Health

## 2024-03-14 ENCOUNTER — Encounter: Payer: Self-pay | Admitting: Adult Health

## 2024-03-14 VITALS — BP 112/72 | HR 76 | Ht 68.0 in | Wt 122.0 lb

## 2024-03-14 DIAGNOSIS — F0390 Unspecified dementia without behavioral disturbance: Secondary | ICD-10-CM | POA: Diagnosis not present

## 2024-03-14 NOTE — Progress Notes (Signed)
 I called the patient's son Rankin after the visit.  Patient gave me permission to call him and provided me with his phone number.  Advised him that I called his dad and updated him with the same information.  Did advise that the patient was not aware what medication she is taking.  He plans to follow-up with the home health service and discuss long-term plans with his dad.

## 2024-03-14 NOTE — Progress Notes (Signed)
 PATIENT: Laura Pollard DOB: 1941/04/21  REASON FOR VISIT: follow up HISTORY FROM: patient PRIMARY NEUROLOGIST: Dr. Margaret  Chief Complaint  Patient presents with   Memory Loss    RM 19 alone  Pt is well, reports her memory has declined since last visit.      HISTORY OF PRESENT ILLNESS: Today 03/14/24:  Laura Pollard is a 83 y.o. female with a history of memory disturbance. Returns today for follow-up.  Patient is here today alone.  She reports that she does not have any family locally.  She does have 1 son that lives in [place]  and another in [place] .  Her husband has Parkinson disease and is homebound.  They have a caregiver that comes in 95 to help him.  She reports that she is able to manage all ADLs independently.  She manages her own medications appointments and finances.  Although today she is not sure what medications she takes.  Patient is very repetitive today.  I was able to call her son Rankin with her present in the room and discussed with him.  He feels that her memory has also declined.  He states that he has been working the last 5 years to try to get them more assistance but they have refused.  He also does not feel that she should be driving as she has gotten lost going to an appointment before.  He reports he has not been able to get his dad to take the keys away from her.  I also spoke to her husband on the phone.  He also feels that her memory has gotten worse.  He states that she is very repetitive.  He does have to help her with the finances.  In the past the patient has been on Namenda  but it does not appear that she is currently taking this.  She returns today for an evaluation.   01/26/23: Laura Pollard is a 83 y.o. female who has been followed in this office for memory loss. Returns today for follow-up.  Patient reports she has noticed a change in her short-term memory. She residense in her own resident. She is the primary caregiver for her husband  who has Parkinson's.  She states that she does not have much help. She does have aid that comes when she leave.  At home she completes all ADLS independently. Operates a motor vehicle without difficulty. Not sleeping well because she has to get up with her husband. Reports that she thinks her husbands caregiver is stealing her items.   States that she is no longer on Namenda - not sure why  HISTORY UPDATE (07/09/21, VRP): Since last visit, continues with memory loss. High stress levels, anxiety, depression, related to caregiver stress and husband's parkinson's disease.   PRIOR HPI (01/06/21, Dr. Jenel): Ms. Alen is a 83 year old right-handed white female with a history of a mild memory disturbance.  The patient is under a lot of stress taking care of her husband who has medical problems including Parkinson's disease.  The patient has had ongoing dizziness, she was asked to drop the Namenda  dose to 5 mg twice daily but never did, she continues to take 10 mg twice daily.  The patient has not had any falls but she remains quite dizzy.  The patient over the last years had some painful itching sensation in the medial aspect of her right shoulder blade.  She does have some mild neck pain without pain down the arms.  She  has been seen through a dermatologist, topical medications have not been effective.  The patient is very concerned about her memory.  She does use an Exelon  patch as well.    REVIEW OF SYSTEMS: Out of a complete 14 system review of symptoms, the patient complains only of the following symptoms, and all other reviewed systems are negative.  ALLERGIES: Allergies  Allergen Reactions   Gabapentin      Dizziness    Indocin [Indomethacin] Other (See Comments)    Nausea and vomiting    Keflex [Cephalexin] Other (See Comments)    unknown reaction    Lipitor [Atorvastatin] Other (See Comments)    Muscle aches    Pravastatin Other (See Comments)    Muscle aches    Tape Rash    PAPER TAPE  --- RASH    HOME MEDICATIONS: Outpatient Medications Prior to Visit  Medication Sig Dispense Refill   ALPRAZolam  (XANAX ) 0.5 MG tablet Take 0.5 mg by mouth at bedtime as needed for anxiety.     atorvastatin (LIPITOR) 10 MG tablet Take 10 mg by mouth daily.     cholecalciferol  (VITAMIN D ) 1000 UNITS tablet Take 2,000 Units by mouth daily.     FLUoxetine  (PROZAC ) 20 MG tablet Take 20 mg by mouth daily.     memantine  (NAMENDA ) 5 MG tablet TAKE 1 TABLET BY MOUTH TWICE A DAY 180 tablet 0   rivastigmine  (EXELON ) 4.6 mg/24hr apply ONE PATCH onto THE SKIN daily. 30 patch 11   No facility-administered medications prior to visit.    PAST MEDICAL HISTORY: Past Medical History:  Diagnosis Date   Abnormal heart rhythm    Anxiety    Arthritis    Atypical chest pain    normal ETT/Echo in 2003.. 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   Scoliosis    Stroke (HCC)    tia no deficits    PAST SURGICAL HISTORY: Past Surgical History:  Procedure Laterality Date   CARDIAC CATHETERIZATION  2010   normal   cataract surgery     CESAREAN SECTION     COLONOSCOPY     ETT  2003   also ECHO..normal   MASS EXCISION Right 07/03/2021   Procedure: Right 2nd toe mass excision; possible soft tissue rearrangement;  Surgeon: Kit Rush, MD;  Location: [facility];  Service: Orthopedics;  Laterality: Right;    OVARIAN CYST SURGERY     RETINAL DETACHMENT SURGERY Right 10 years ago   TONSILLECTOMY      FAMILY HISTORY: Family History  Problem Relation Age of Onset   Heart disease Mother    CAD Mother    AAA (abdominal aortic aneurysm) Father    Bladder Cancer Father    Kidney  cancer Sister        metastasis to lung   Breast cancer Maternal Aunt 33   HIV Son    Breast cancer Cousin    Alzheimer's disease Neg Hx    Dementia Neg Hx     SOCIAL HISTORY: Social History   Socioeconomic History   Marital status: Married    Spouse name: Not on file   Number of children:  2   Years of education: some coll.   Highest education level: Not on file  Occupational History   Not on file  Tobacco Use   Smoking status: Former    Current packs/day: 0.00    Types: Cigarettes    Quit date: 05/11/1966    Years since quitting: 57.8   Smokeless tobacco: Never  Substance and Sexual Activity   Alcohol use: Not Currently    Comment: rarely   Drug use: No   Sexual activity: Not on file  Other Topics Concern   Not on file  Social History Narrative   Patient is right handed.   Patient drinks 1-2 cups of caffeine daily.   Lives with husband (has parkinsons)   Social Drivers of Corporate Investment Banker Strain: Low Risk  (04/25/2019)   Overall Financial Resource Strain (CARDIA)    Difficulty of Paying Living Expenses: Not hard at all  Food Insecurity: No Food Insecurity (12/24/2023)   Hunger Vital Sign    Worried About Running Out of Food in the Last Year: Never true    Ran Out of Food in the Last Year: Never true  Transportation Needs: No Transportation Needs (12/24/2023)   PRAPARE - Administrator, Civil Service (Medical): No    Lack of Transportation (Non-Medical): No  Physical Activity: Inactive (04/25/2019)   Exercise Vital Sign    Days of Exercise per Week: 0 days    Minutes of Exercise per Session: 0 min  Stress: Stress Concern Present (04/25/2019)   [employer] of Occupational Health - Occupational Stress Questionnaire    Feeling of Stress : Rather much  Social Connections: Socially Isolated (04/25/2019)   Social Connection and Isolation Panel    Frequency of Communication with Friends and Family: Once a week    Frequency of Social  Gatherings with Friends and Family: Never    Attends Religious Services: Never    Database Administrator or Organizations: No    Attends Banker Meetings: Never    Marital Status: Married  Catering Manager Violence: Not At Risk (12/24/2023)   Humiliation, Afraid, Rape, and Kick questionnaire    Fear of Current or Ex-Partner: No    Emotionally Abused: No    Physically Abused: No    Sexually Abused: No    PHYSICAL EXAM  Vitals:   03/14/24 1057  BP: 112/72  Pulse: 76  Weight: 122 lb (55.3 kg)  Height: 5' 8 (1.727 m)   Body mass index is 18.55 kg/m.     03/14/2024   11:02 AM 01/26/2023    3:08 PM 07/09/2021   11:06 AM  MMSE - Mini Mental State Exam  Orientation to time 4 2 4   Orientation to Place 3 4 4   Registration 3 3 3   Attention/ Calculation 1 3 2   Recall 0 1 2  Language- name 2 objects 2 2 2   Language- repeat 1 1 1   Language- follow 3 step command 3 3 3   Language- read & follow direction 1 1 1   Write a sentence 1 1 1   Copy design 0 0 1  Total score 19 21 24      Generalized: Well developed, in no acute distress   Neurological examination  Mentation: Alert oriented to time, place, history taking. Follows all commands speech and language fluent.  Repetitive Cranial nerve II-XII: Pupils were equal round reactive to light. Extraocular movements were full, visual field were full on confrontational test. Facial sensation and strength were  normal. Head turning and shoulder shrug  were normal and symmetric. Motor: The motor testing reveals 5 over 5 strength of all 4 extremities. Good symmetric motor tone is noted throughout.  Sensory: Sensory testing is intact to soft touch on all 4 extremities. No evidence of extinction is noted.  Coordination: Cerebellar testing reveals good finger-nose-finger and heel-to-shin bilaterally.  Gait and station: Gait is normal.   DIAGNOSTIC DATA (LABS, IMAGING, TESTING) - I reviewed patient records, labs, notes, testing and  imaging myself where available.  Lab Results  Component Value Date   WBC 7.1 11/14/2019   HGB 15.0 11/14/2019   HCT 44.0 11/14/2019   MCV 90.1 11/14/2019   PLT 177 11/14/2019      Component Value Date/Time   NA 143 11/14/2019 1141   K 3.9 11/14/2019 1141   CL 102 11/14/2019 1141   CO2 25 11/14/2019 1132   GLUCOSE 148 (H) 11/14/2019 1141   BUN 16 11/14/2019 1141   CREATININE 0.60 11/14/2019 1141   CALCIUM  10.1 11/14/2019 1132   PROT 8.7 (H) 11/14/2019 1132   ALBUMIN 5.4 (H) 11/14/2019 1132   AST 38 11/14/2019 1132   ALT 44 11/14/2019 1132   ALKPHOS 81 11/14/2019 1132   BILITOT 1.6 (H) 11/14/2019 1132   GFRNONAA >60 11/14/2019 1132   GFRAA >60 11/14/2019 1132   Lab Results  Component Value Date   CHOL 185 03/13/2017   HDL 44 03/13/2017   LDLCALC 113 (H) 03/13/2017   TRIG 142 03/13/2017   CHOLHDL 4.2 03/13/2017   Lab Results  Component Value Date   HGBA1C 5.5 03/13/2017   Lab Results  Component Value Date   VITAMINB12 1,633 (H) 09/14/2014       ASSESSMENT AND PLAN 83 y.o. year old female  has a past medical history of Abnormal heart rhythm, Anxiety, Arthritis, Atypical chest pain, Concussion with loss of consciousness (04/27/2016), Depression, Gait disorder (09/14/2014), Headache, Hypercholesteremia, Hyperlipidemia, Liver disease, Memory difficulties (02/05/2015), Multiple allergies, MVP (mitral valve prolapse), Nocturnal leg cramps (09/14/2014), Post-concussion vertigo (04/27/2016), Scoliosis, and Stroke (HCC). here with:  Memory disturbance  -  MMSE 19/30  previously 21/30 - Patient is not sure what medication she is taking.  I will reach out to her son again and ask him to verify her medications. -I advised the patient that I do not feel she should be driving. -I will speak to her son after this visit . - FU in 6-7 months or sooner if needed     Duwaine Russell, MSN, NP-C 03/14/2024, 11:16 AM [facility] [address] [place], [place] [address] [phone]

## 2024-03-31 ENCOUNTER — Other Ambulatory Visit: Payer: Self-pay | Admitting: Adult Health

## 2024-03-31 NOTE — Telephone Encounter (Signed)
 Last seen on 03/14/24 Follow up scheduled on 12/28/24   Dispensed Days Supply Quantity Provider Pharmacy  MEMANTINE  HCL 5 MG TABLET 01/05/2024 90 180 each Sherryl Bouchard, NP [facility] [phone] G

## 2024-04-09 DIAGNOSIS — F411 Generalized anxiety disorder: Secondary | ICD-10-CM | POA: Diagnosis not present

## 2024-04-09 DIAGNOSIS — E782 Mixed hyperlipidemia: Secondary | ICD-10-CM | POA: Diagnosis not present

## 2024-04-09 DIAGNOSIS — E785 Hyperlipidemia, unspecified: Secondary | ICD-10-CM | POA: Diagnosis not present

## 2024-04-09 DIAGNOSIS — F0390 Unspecified dementia without behavioral disturbance: Secondary | ICD-10-CM | POA: Diagnosis not present

## 2024-12-28 ENCOUNTER — Ambulatory Visit: Admitting: Adult Health
